# Patient Record
Sex: Male | Born: 1965 | Race: White | Hispanic: No | Marital: Single | Smoking: Never smoker
Health system: Southern US, Community
[De-identification: ages and names within clinical notes are randomized; demographics above are authoritative.]

## PROBLEM LIST (undated history)

## (undated) DIAGNOSIS — I10 Essential (primary) hypertension: Secondary | ICD-10-CM

## (undated) DIAGNOSIS — M8430XA Stress fracture, unspecified site, initial encounter for fracture: Secondary | ICD-10-CM

## (undated) DIAGNOSIS — M179 Osteoarthritis of knee, unspecified: Secondary | ICD-10-CM

## (undated) DIAGNOSIS — M4316 Spondylolisthesis, lumbar region: Secondary | ICD-10-CM

## (undated) DIAGNOSIS — M532X6 Spinal instabilities, lumbar region: Secondary | ICD-10-CM

## (undated) DIAGNOSIS — K469 Unspecified abdominal hernia without obstruction or gangrene: Secondary | ICD-10-CM

## (undated) DIAGNOSIS — E669 Obesity, unspecified: Secondary | ICD-10-CM

## (undated) DIAGNOSIS — K760 Fatty (change of) liver, not elsewhere classified: Secondary | ICD-10-CM

## (undated) DIAGNOSIS — G8929 Other chronic pain: Secondary | ICD-10-CM

## (undated) DIAGNOSIS — E559 Vitamin D deficiency, unspecified: Secondary | ICD-10-CM

## (undated) DIAGNOSIS — K219 Gastro-esophageal reflux disease without esophagitis: Secondary | ICD-10-CM

## (undated) DIAGNOSIS — I872 Venous insufficiency (chronic) (peripheral): Secondary | ICD-10-CM

## (undated) HISTORY — PX: FRACTURE SURGERY: SHX138

## (undated) HISTORY — PX: SEPTOPLASTY: SUR1290

## (undated) HISTORY — DX: Osteoarthritis of knee, unspecified: M17.9

## (undated) HISTORY — PX: REPLACEMENT TOTAL KNEE: SUR1224

## (undated) HISTORY — DX: Unspecified abdominal hernia without obstruction or gangrene: K46.9

## (undated) HISTORY — PX: HERNIA REPAIR: SHX51

## (undated) HISTORY — DX: Gastro-esophageal reflux disease without esophagitis: K21.9

## (undated) HISTORY — DX: Vitamin D deficiency, unspecified: E55.9

## (undated) HISTORY — DX: Venous insufficiency (chronic) (peripheral): I87.2

## (undated) HISTORY — PX: FOOT SURGERY: SHX648

## (undated) HISTORY — DX: Obesity, unspecified: E66.9

## (undated) HISTORY — DX: Stress fracture, unspecified site, initial encounter for fracture: M84.30XA

---

## 2004-04-14 ENCOUNTER — Ambulatory Visit: Payer: Self-pay | Admitting: Unknown Physician Specialty

## 2005-11-17 ENCOUNTER — Ambulatory Visit: Payer: Self-pay | Admitting: Ophthalmology

## 2008-03-02 ENCOUNTER — Ambulatory Visit: Payer: Self-pay | Admitting: Unknown Physician Specialty

## 2008-03-12 ENCOUNTER — Inpatient Hospital Stay: Payer: Self-pay | Admitting: Unknown Physician Specialty

## 2008-03-14 ENCOUNTER — Ambulatory Visit: Payer: Self-pay | Admitting: Cardiology

## 2009-05-08 HISTORY — PX: OTHER SURGICAL HISTORY: SHX169

## 2010-03-25 ENCOUNTER — Ambulatory Visit: Payer: Self-pay | Admitting: Unknown Physician Specialty

## 2010-04-05 ENCOUNTER — Inpatient Hospital Stay: Payer: Self-pay | Admitting: Unknown Physician Specialty

## 2010-12-20 ENCOUNTER — Ambulatory Visit: Payer: Self-pay

## 2010-12-23 ENCOUNTER — Ambulatory Visit: Payer: Self-pay | Admitting: Podiatry

## 2011-07-19 ENCOUNTER — Ambulatory Visit: Payer: Self-pay | Admitting: Internal Medicine

## 2013-06-02 ENCOUNTER — Ambulatory Visit: Payer: Self-pay | Admitting: Orthopedic Surgery

## 2014-02-06 ENCOUNTER — Ambulatory Visit: Payer: Self-pay | Admitting: Unknown Physician Specialty

## 2014-02-12 ENCOUNTER — Ambulatory Visit: Payer: Self-pay

## 2014-02-12 LAB — OCCULT BLOOD X 1 CARD TO LAB, STOOL: Occult Blood, Feces: POSITIVE

## 2014-06-18 ENCOUNTER — Ambulatory Visit: Payer: Self-pay

## 2016-02-07 ENCOUNTER — Other Ambulatory Visit: Payer: Self-pay | Admitting: Orthopedic Surgery

## 2016-02-07 DIAGNOSIS — M4316 Spondylolisthesis, lumbar region: Secondary | ICD-10-CM

## 2016-02-16 ENCOUNTER — Ambulatory Visit
Admission: RE | Admit: 2016-02-16 | Discharge: 2016-02-16 | Disposition: A | Discharge status: Home / Self Care | Payer: 59 | Source: Ambulatory Visit | Attending: Orthopedic Surgery | Admitting: Orthopedic Surgery

## 2016-02-16 DIAGNOSIS — M5126 Other intervertebral disc displacement, lumbar region: Secondary | ICD-10-CM | POA: Diagnosis not present

## 2016-02-16 DIAGNOSIS — M48061 Spinal stenosis, lumbar region without neurogenic claudication: Secondary | ICD-10-CM | POA: Diagnosis not present

## 2016-02-16 DIAGNOSIS — M4316 Spondylolisthesis, lumbar region: Secondary | ICD-10-CM | POA: Diagnosis present

## 2017-03-09 IMAGING — MR MR LUMBAR SPINE W/O CM
4 of 5 series · 24 of 48 positions shown · non-contrast
Comparison: None.

CLINICAL DATA: Lumbar spondylolisthesis. Back pain with left
buttock and leg pain

EXAM:
MRI LUMBAR SPINE WITHOUT CONTRAST
TECHNIQUE: Multiplanar, multisequence MR imaging of the lumbar spine was
performed. No intravenous contrast was administered.

[Series 2: T2 · sagittal · 4.0mm · 0.81mm/px · 6 of 15 slices shown (1 of 2)]
[im 1/15]
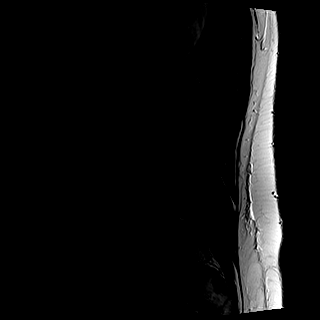
[im 3/15]
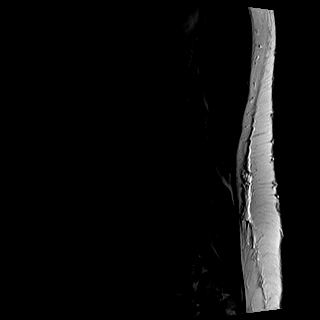
[im 6/15]
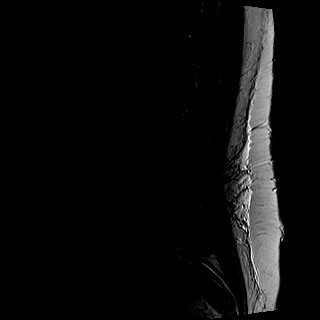
[im 9/15]
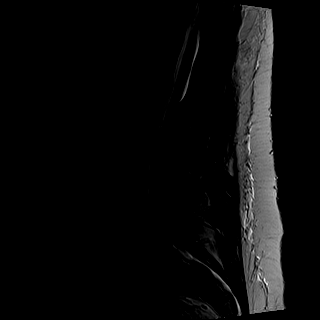
[im 12/15]
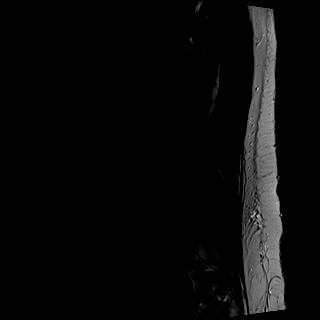
[im 15/15]
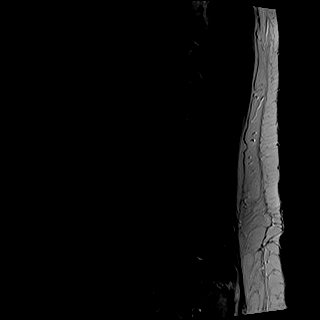

[Series 3: T1 · sagittal · 4.0mm · 0.41mm/px · 6 of 15 slices shown (1 of 2)]
[im 1/15]
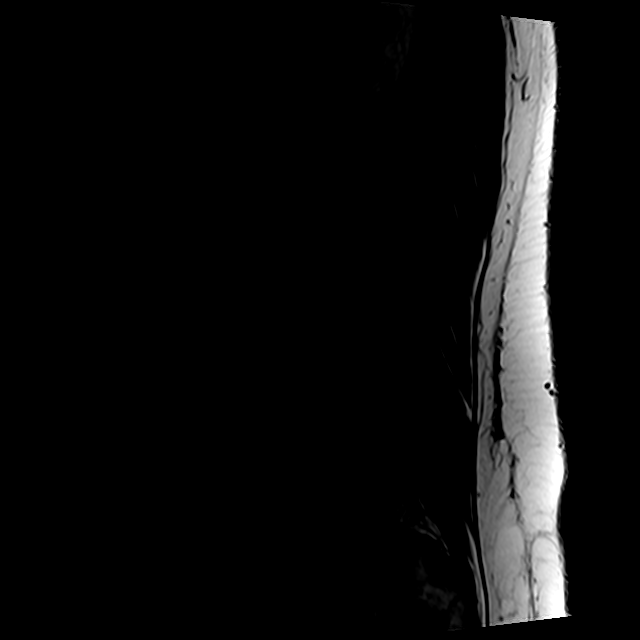
[im 3/15]
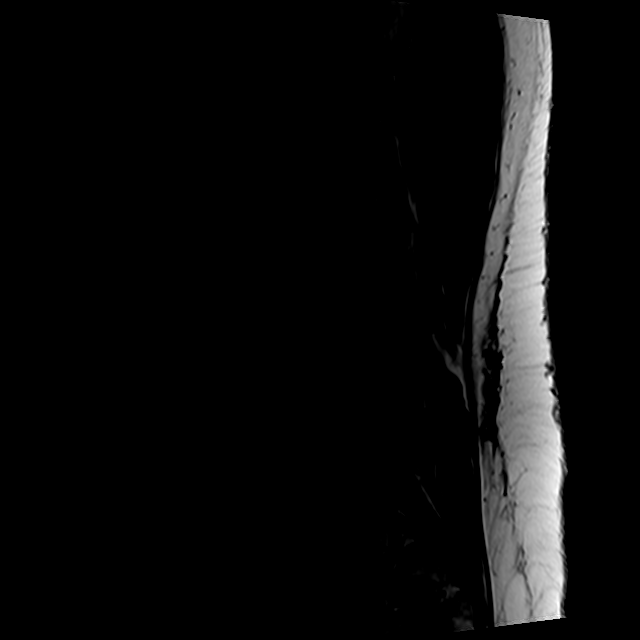
[im 6/15]
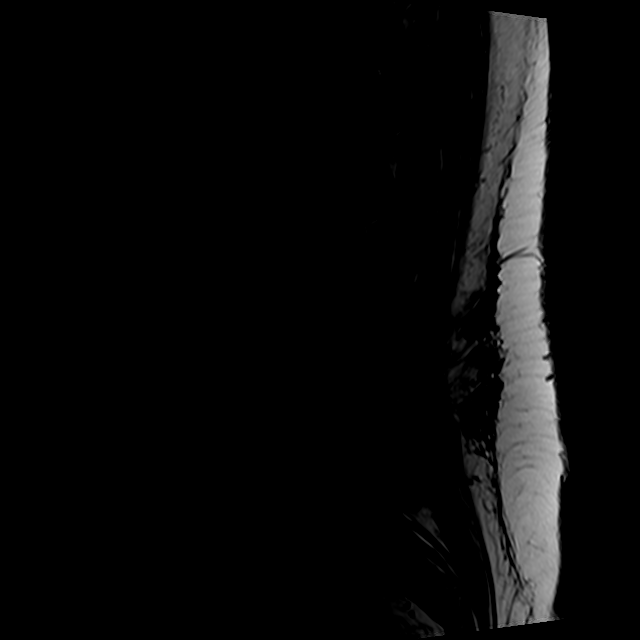
[im 9/15]
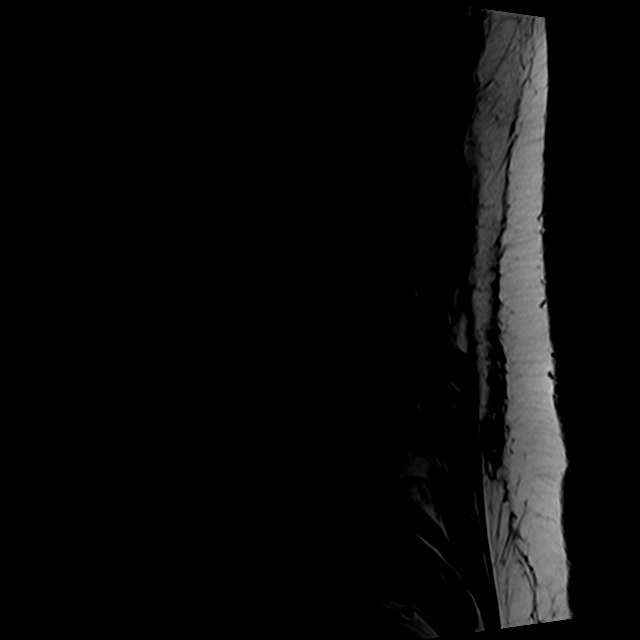
[im 12/15]
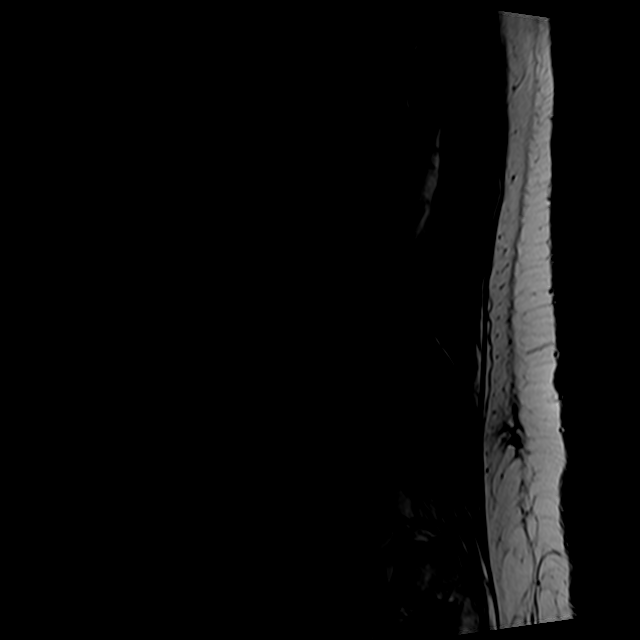
[im 15/15]
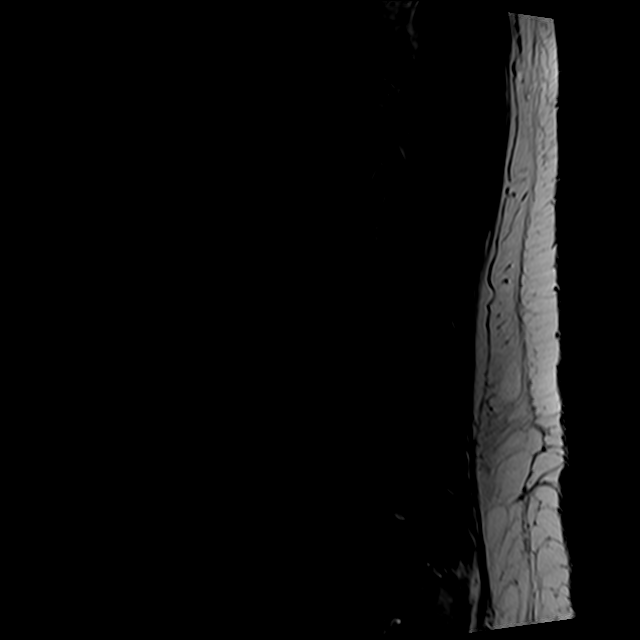

[Series 5: T2 · axial · 4.0mm · 0.78mm/px · z∈[-29,+188]mm · 9 of 37 slices shown (2 of 2)]
[im 1/37]
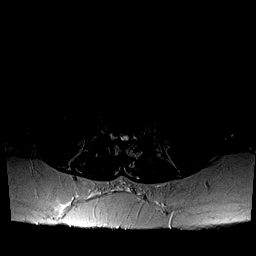
[im 6/37]
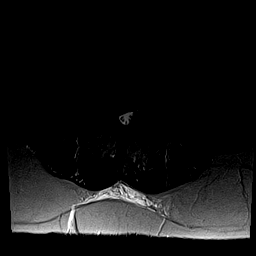
[im 11/37]
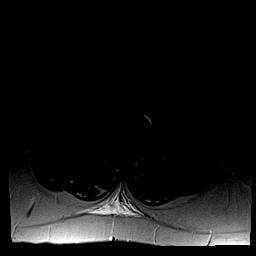
[im 16/37]
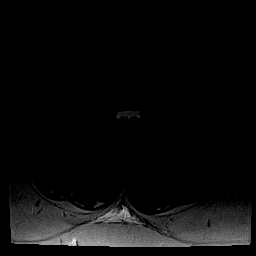
[im 19/37]
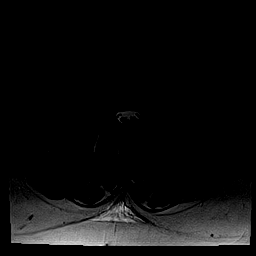
[im 21/37]
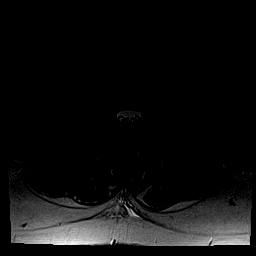
[im 26/37]
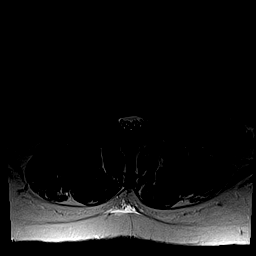
[im 31/37]
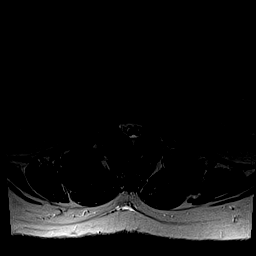
[im 37/37]
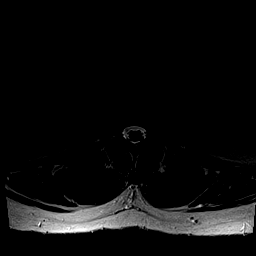

[Series 6: T1 · axial · 4.0mm · 0.31mm/px · z∈[-5,+159]mm · 3 of 37 slices shown (2 of 2)]
[im 6/37]
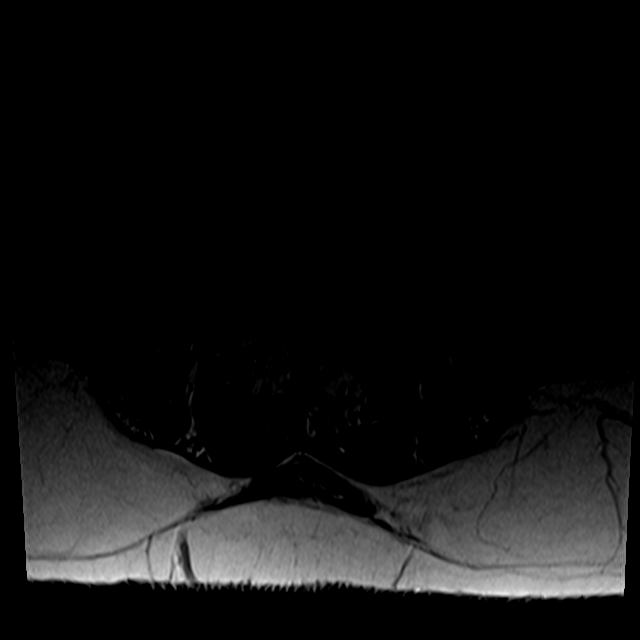
[im 19/37]
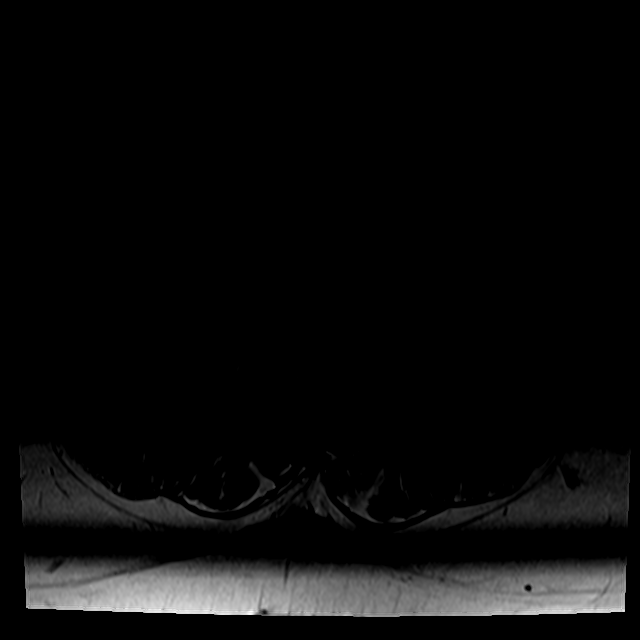
[im 31/37]
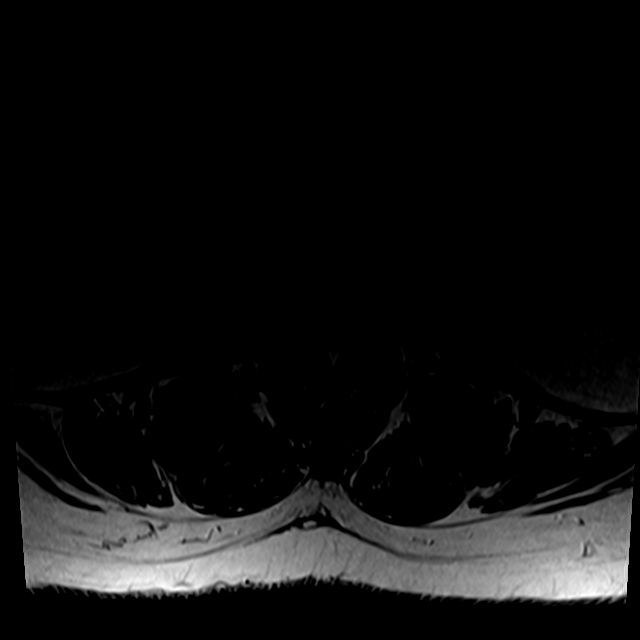

[24 of 48 positions shown; findings below may reference images not displayed]

FINDINGS: Segmentation:  Normal

Alignment: Mild retrolisthesis L1-2, L2-3, L3-4. Grade 1 anterior
listhesis L4-5.

Vertebrae:  Negative for fracture or mass.  Normal bone marrow.

Conus medullaris: Extends to the L2-3 level and appears normal.

Paraspinal and other soft tissues: Paraspinous muscles normal. No
retroperitoneal mass or adenopathy.

Disc levels:

L1-2:  Mild disc bulging without significant spinal stenosis.

L2-3: Mild disc bulging and mild facet degeneration. No significant
spinal or foraminal stenosis

L3-4: Mild disc and facet degeneration without significant stenosis.

L4-5: 3 mm anterior listhesis. Advanced facet degeneration. Severe
spinal stenosis. Mild foraminal stenosis bilaterally. Subarticular
stenosis bilaterally.

L5-S1: Small right paracentral disc protrusion with impingement of
the right S1 nerve root. Mild impingement of the left S1 nerve root
due to disc bulging and spurring. Mild foraminal narrowing
bilaterally. Bilateral facet hypertrophy.
IMPRESSION: Grade 1 anterior listhesis at L4-5 with severe spinal stenosis.
Subarticular stenosis bilaterally

Small right paracentral disc protrusion L5-S1 with impingement of
the right S1 nerve root.

## 2017-05-08 HISTORY — PX: COLONOSCOPY: SHX174

## 2017-09-11 ENCOUNTER — Other Ambulatory Visit: Payer: Self-pay | Admitting: Family Medicine

## 2017-09-11 DIAGNOSIS — R1013 Epigastric pain: Secondary | ICD-10-CM

## 2017-09-18 ENCOUNTER — Ambulatory Visit
Admission: RE | Admit: 2017-09-18 | Discharge: 2017-09-18 | Disposition: A | Discharge status: Home / Self Care | Payer: BLUE CROSS/BLUE SHIELD | Source: Ambulatory Visit | Attending: Family Medicine | Admitting: Family Medicine

## 2017-09-18 DIAGNOSIS — R1013 Epigastric pain: Secondary | ICD-10-CM | POA: Diagnosis not present

## 2019-01-27 ENCOUNTER — Ambulatory Visit: Payer: Self-pay

## 2019-01-27 DIAGNOSIS — Z23 Encounter for immunization: Secondary | ICD-10-CM

## 2019-01-27 LAB — POCT LIPID PANEL
HDL: 46
LDL: 48
Non-HDL: 70
TC/HDL: 2.5
TC: 116
TRG: 108

## 2019-01-27 LAB — GLUCOSE, POCT (MANUAL RESULT ENTRY): POC Glucose: 94 mg/dl (ref 70–99)

## 2019-01-27 NOTE — Progress Notes (Signed)
     Patient ID: Timothy Gilmore, male    DOB: [birth year], 53 y.o.   MRN: [id number]    Thank you!!  Apolonio Schneiders RN  [place] [facility] [place]: [phone]  Cell:  [phone] Website: [email or web address]

## 2019-01-28 ENCOUNTER — Other Ambulatory Visit: Payer: Self-pay

## 2019-12-17 ENCOUNTER — Ambulatory Visit (HOSPITAL_COMMUNITY)
Admission: EM | Admit: 2019-12-17 | Discharge: 2019-12-17 | Disposition: A | Discharge status: Home / Self Care | Payer: BC Managed Care – PPO

## 2019-12-17 ENCOUNTER — Other Ambulatory Visit: Payer: Self-pay

## 2019-12-17 ENCOUNTER — Ambulatory Visit
Admission: EM | Admit: 2019-12-17 | Discharge: 2019-12-17 | Disposition: A | Discharge status: Home / Self Care | Payer: BC Managed Care – PPO | Attending: Family Medicine | Admitting: Family Medicine

## 2019-12-17 ENCOUNTER — Encounter: Payer: Self-pay | Admitting: Emergency Medicine

## 2019-12-17 DIAGNOSIS — M71121 Other infective bursitis, right elbow: Secondary | ICD-10-CM | POA: Diagnosis not present

## 2019-12-17 DIAGNOSIS — M7021 Olecranon bursitis, right elbow: Secondary | ICD-10-CM

## 2019-12-17 HISTORY — DX: Essential (primary) hypertension: I10

## 2019-12-17 MED ORDER — MELOXICAM 15 MG PO TABS: 15.0000 mg | ORAL_TABLET | Freq: Every day | ORAL | 0 refills | Status: DC | PRN

## 2019-12-17 MED ORDER — SULFAMETHOXAZOLE-TRIMETHOPRIM 800-160 MG PO TABS: 1.0000 | ORAL_TABLET | Freq: Two times a day (BID) | ORAL | 0 refills | Status: AC

## 2019-12-17 NOTE — Discharge Instructions (Signed)
Rest, ice, elevation.  Medication as prescribed.  If you worsen (fever, worsening redness), please go to the ER.  Take care  Dr. Adriana Simas

## 2019-12-17 NOTE — ED Provider Notes (Signed)
[facility]    CSN: [id number] Arrival date & time: 12/17/19  0810  History   Chief Complaint Chief Complaint  Patient presents with  . Arm Swelling    right elbow    HPI   54 year old male presents with the above complaint.  Patient reports that he noticed a scab on his right elbow a few days ago.  He states that the elbow has been slightly painful.  This morning he awoke and it was red, warm, and swollen.  No fever.  No trauma, fall, injury.  He states that he takes Aleve every day for knee pain.  He states that this has not helped.  No current drainage.  No other reported symptoms.  No other complaints.  Past Medical History:  Diagnosis Date  . Hypertension    Home Medications    Prior to Admission medications   Medication Sig Start Date End Date Taking? Authorizing Provider  amLODipine (NORVASC) 10 MG tablet Take 10 mg by mouth daily. 10/05/19   [provider]  meloxicam (MOBIC) 15 MG tablet Take 1 tablet (15 mg total) by mouth daily as needed for pain. 12/17/19   Tommie Sams, DO  sulfamethoxazole-trimethoprim (BACTRIM DS) 800-160 MG tablet Take 1 tablet by mouth 2 (two) times daily for 7 days. 12/17/19 12/24/19  Tommie Sams, DO   Social History Social History   Tobacco Use  . Smoking status: Never Smoker  . Smokeless tobacco: Never Used  Vaping Use  . Vaping Use: Never used  Substance Use Topics  . Alcohol use: Yes  . Drug use: Never   Allergies   Penicillins  Review of Systems Review of Systems  Constitutional: Negative for fever.  Musculoskeletal:       Right elbow pain, redness.    Physical Exam Triage Vital Signs ED Triage Vitals  Enc Vitals Group     BP 12/17/19 0821 (!) 149/100     Pulse Rate 12/17/19 0821 (!) 120     Resp 12/17/19 0821 18     Temp 12/17/19 0821 98.2 F (36.8 C)     Temp src --      SpO2 12/17/19 0821 99 %     Weight 12/17/19 0819 245 lb (111.1 kg)     Height 12/17/19 0819 6\' 2"  (1.88 m)     Head  Circumference --      Peak Flow --      Pain Score 12/17/19 0819 0     Pain Loc --      Pain Edu? --      Excl. in GC? --    Updated Vital Signs BP (!) 149/100 (BP Location: Left Arm)   Pulse (!) 120   Temp 98.2 F (36.8 C)   Resp 18   Ht 6\' 2"  (1.88 m)   Wt 111.1 kg   SpO2 99%   BMI 31.46 kg/m   Visual Acuity Right Eye Distance:   Left Eye Distance:   Bilateral Distance:    Right Eye Near:   Left Eye Near:    Bilateral Near:     Physical Exam Vitals and nursing note reviewed.  Constitutional:      General: He is not in acute distress.    Appearance: Normal appearance. He is not ill-appearing.  HENT:     Head: Normocephalic and atraumatic.  Eyes:     General:        Right eye: No discharge.  Left eye: No discharge.     Conjunctiva/sclera: Conjunctivae normal.  Cardiovascular:     Rate and Rhythm: Regular rhythm. Tachycardia present.  Pulmonary:     Effort: Pulmonary effort is normal. No respiratory distress.  Musculoskeletal:     Comments: Right elbow -erythema, warmth and swelling noted over the right olecranon.  Redness appears to be streaking both proximally and distally.  Neurological:     Mental Status: He is alert.  Psychiatric:        Mood and Affect: Mood normal.        Behavior: Behavior normal.    UC Treatments / Results  Labs (all labs ordered are listed, but only abnormal results are displayed) Labs Reviewed - No data to display  EKG   Radiology No results found.  Procedures Procedures (including critical care time)  Medications Ordered in UC Medications - No data to display  Initial Impression / Assessment and Plan / UC Course  I have reviewed the triage vital signs and the nursing notes.  Pertinent labs & imaging results that were available during my care of the patient were reviewed by me and considered in my medical decision making (see chart for details).    54 year old male presents with olecranon bursitis with  concern for underlying infection.  Placing on Bactrim and meloxicam.  Final Clinical Impressions(s) / UC Diagnoses   Final diagnoses:  Olecranon bursitis, right elbow  Infection of olecranon bursa, right     Discharge Instructions     Rest, ice, elevation.  Medication as prescribed.  If you worsen (fever, worsening redness), please go to the ER.  Take care  Dr. Adriana Simas    ED Prescriptions    Medication Sig Dispense Auth. Provider   sulfamethoxazole-trimethoprim (BACTRIM DS) 800-160 MG tablet Take 1 tablet by mouth 2 (two) times daily for 7 days. 14 tablet Xee Hollman G, DO   meloxicam (MOBIC) 15 MG tablet Take 1 tablet (15 mg total) by mouth daily as needed for pain. 30 tablet Tommie Sams, DO     PDMP not reviewed this encounter.   Everlene Other [place], [place] 12/17/19 [phone]

## 2019-12-17 NOTE — ED Triage Notes (Signed)
Patient c/o right elbow swelling that started this morning. Denies injury. States he had a sore on his elbow a few days ago.

## 2022-02-06 ENCOUNTER — Other Ambulatory Visit: Payer: Self-pay | Admitting: Family Medicine

## 2022-02-06 DIAGNOSIS — K760 Fatty (change of) liver, not elsewhere classified: Secondary | ICD-10-CM

## 2022-05-12 ENCOUNTER — Ambulatory Visit: Payer: BC Managed Care – PPO

## 2022-07-07 ENCOUNTER — Ambulatory Visit
Admission: RE | Admit: 2022-07-07 | Discharge: 2022-07-07 | Disposition: A | Discharge status: Home / Self Care | Payer: BC Managed Care – PPO | Source: Ambulatory Visit | Attending: Family Medicine | Admitting: Family Medicine

## 2022-07-07 DIAGNOSIS — K760 Fatty (change of) liver, not elsewhere classified: Secondary | ICD-10-CM | POA: Diagnosis present

## 2022-07-12 ENCOUNTER — Other Ambulatory Visit: Payer: Self-pay | Admitting: Orthopedic Surgery

## 2022-07-12 DIAGNOSIS — M4807 Spinal stenosis, lumbosacral region: Secondary | ICD-10-CM

## 2022-07-19 ENCOUNTER — Other Ambulatory Visit: Payer: Self-pay

## 2022-07-19 ENCOUNTER — Ambulatory Visit
Admission: RE | Admit: 2022-07-19 | Discharge: 2022-07-19 | Disposition: A | Discharge status: Home / Self Care | Payer: Self-pay | Source: Ambulatory Visit | Attending: Orthopedic Surgery | Admitting: Orthopedic Surgery

## 2022-07-19 DIAGNOSIS — Z049 Encounter for examination and observation for unspecified reason: Secondary | ICD-10-CM

## 2022-07-25 NOTE — Progress Notes (Unsigned)
Referring Physician:  Sharyne Peach, MD [place],  [place] [address]  Primary Physician:  Sharyne Peach, MD  History of Present Illness: 07/27/2022 [name] has a history of GERD, obesity, venous stasis right LE, and fatty liver.   Last seen by Dr. Izora Ribas in 2017 for slip L4-L5 with severe central stenosis along with disc at L5-S1 on right side. He was scheduled for L4-S1 TLIF with PSF. This was not done.   He has intermittent (but more frequent) left sided LBP with left  posterior leg pain to knee and sometimes to ankle. Pain is worse when he is any position for over 45 minutes. Some relief with changing positions and grocery cart. Pain is worse as the day progresses. He has numbness in both feet. No weakness in legs.   Bowel/Bladder Dysfunction: none  Conservative measures:  Physical therapy:  seeing chiropractor once a month, exercise at home, no PT in last year.  Multimodal medical therapy including regular antiinflammatories: aleve  Injections: No epidural steroid injections  Past Surgery: No spinal surgery  Jefferson Lajuan Lines has no symptoms of cervical myelopathy.  The symptoms are causing a significant impact on the patient's life.   Review of Systems:  A 10 point review of systems is negative, except for the pertinent positives and negatives detailed in the HPI.  Past Medical History: Past Medical History:  Diagnosis Date   Hypertension     Past Surgical History: No past surgical history on file.  Allergies: Allergies as of 07/27/2022 - Review Complete 07/27/2022  Allergen Reaction Noted   Penicillins Rash 11/03/2011    Medications: Outpatient Encounter Medications as of 07/27/2022  Medication Sig   amLODipine (NORVASC) 10 MG tablet Take 10 mg by mouth daily.   ascorbic acid (VITAMIN C) 100 MG tablet Take by mouth.   cholecalciferol (VITAMIN D3) 10 MCG (400 UNIT) TABS tablet Take by mouth.   Loratadine 10 MG CAPS Take  by mouth.   metoprolol succinate (TOPROL-XL) 25 MG 24 hr tablet Take 25 mg by mouth daily.   naproxen (NAPROSYN) 500 MG tablet Take by mouth.   [DISCONTINUED] meloxicam (MOBIC) 15 MG tablet Take 1 tablet (15 mg total) by mouth daily as needed for pain.   No facility-administered encounter medications on file as of 07/27/2022.    Social History: Social History   Tobacco Use   Smoking status: Never   Smokeless tobacco: Never  Vaping Use   Vaping Use: Never used  Substance Use Topics   Alcohol use: Yes   Drug use: Never    Family Medical History: No family history on file.  Physical Examination: Vitals:   07/27/22 1344  BP: 124/80  Pulse: (!) 111  SpO2: 97%    General: Patient is well developed, well nourished, calm, collected, and in no apparent distress. Attention to examination is appropriate.  Respiratory: Patient is breathing without any difficulty.   NEUROLOGICAL:     Awake, alert, oriented to person, place, and time.  Speech is clear and fluent. Fund of knowledge is appropriate.   Cranial Nerves: Pupils equal round and reactive to light.  Facial tone is symmetric.    no posterior lumbar tenderness.   No abnormal lesions on exposed skin.   Strength: Side Biceps Triceps Deltoid Interossei Grip Wrist Ext. Wrist Flex.  R 5 5 5 5 5 5 5   L 5 5 5 5 5 5 5    Side Iliopsoas Quads Hamstring PF DF EHL  R 5 5 5 5 5 5   L 5 5 5 5 5 5    Reflexes are 2+ and symmetric at the biceps, triceps, brachioradialis, patella and achilles.   Hoffman's is absent.  Clonus is not present.   Bilateral upper and lower extremity sensation is intact to light touch.     Gait is normal.    Medical Decision Making  Imaging: Lumbar MRI dated 07/26/22:  FINDINGS: Segmentation: Conventional anatomy assumed, with the last open disc space designated L5-S1.Concordant with prior imaging.   Alignment: Degenerative grade 1 anterolisthesis at L4-5 measures 7 mm, similar to previous study.  There is a minimal anterolisthesis at L5-S1 as well.   Vertebrae: No worrisome osseous lesion, acute fracture or pars defect. 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 The visualized sacroiliac joints appear unremarkable.   Conus medullaris: Extends to the L2-3 level and appears normal.   Paraspinal and other soft tissues: No significant paraspinal findings.   Disc levels:   No significant disc space findings at T11-12 or T12-L1.   L1-2: Stable mild loss of disc height with disc bulging and endplate osteophytes. No significant spinal stenosis or nerve root encroachment.   L2-3: Mildly progressive loss of disc height with annular disc bulging, facet and ligamentous hypertrophy. No significant central spinal stenosis. Mild foraminal narrowing bilaterally without L2 nerve root encroachment.   L3-4: Mildly progressive loss of disc height with annular disc bulging, facet and ligamentous hypertrophy. No significant central spinal stenosis. Mild foraminal narrowing bilaterally without L3 nerve root encroachment.   L4-5: Severe multifactorial spinal stenosis again noted secondary to loss of disc height, disc bulging/uncovering and advanced bilateral facet hypertrophy. Moderate lateral recess and foraminal narrowing bilaterally appears mildly progressive.   L5-S1: Chronic loss of disc height with annular disc bulging and a chronic right paracentral disc protrusion. Progressive facet hypertrophy, worse on the right. Unchanged right lateral recess narrowing and probable chronic right S1 nerve root encroachment. Moderate foraminal narrowing bilaterally appears mildly progressive.   IMPRESSION: 1. Mildly progressive multilevel spondylosis compared with previous MRI from 2017. 2. At L4-5, there is severe multifactorial spinal stenosis with moderate lateral recess and foraminal narrowing bilaterally. 3. At L5-S1, there is chronic right S1 nerve root encroachment by  a chronic right paracentral disc protrusion. Moderate foraminal narrowing bilaterally appears mildly progressive. 4. No acute findings or significant changes at the other levels.     Electronically Signed   By: Richardean Sale M.D.   On: 07/26/2022 12:33  I have personally reviewed the images and agree with the above interpretation.   Lumbar xrays dated 07/06/22:  L4-L5 spondylolisthesis with probable pars defect L4, DDD L3-S1, facet hypertrophy L4-S1.   No formal xray report available for above lumbar xrays.   Assessment and Plan: Mr. Kunis is a pleasant 57 y.o. male has intermittent (but more frequent) left sided LBP with left  posterior leg pain to knee and sometimes to ankle. Pain is worse when he is any position for over 45 minutes. Some relief with changing positions and grocery cart.   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 He has severe spinal stenosis L4-L5 with right paracentral disc L5-S1 and chronic right S1 nerve encroachment.   He is ready to discuss surgery options with Dr. Izora Ribas.   Treatment options discussed with patient and following plan made:   - Order for physical therapy for lumbar spine sent to [facility] in [place]. - Will get lumbar flexion/extension xrays prior to  his follow up.  - Follow up with Dr. Izora Ribas in 6-8 weeks to discuss possible surgery options.   Of note, pulse was noted to be 111. He has discussed this with his PCP and is working on cutting down on caffeine. No symptoms of CP, SOB, dizziness, or heart racing. He will continue to follow with PCP.   I spent a total of 25 minutes in face-to-face and non-face-to-face activities related to this patient's care today including review of outside records, review of imaging, review of symptoms, physical exam, discussion of differential diagnosis, discussion of treatment options, and documentation.   Thank you for involving me in the care of this patient.   Geronimo Boot PA-C Dept. of  Neurosurgery

## 2022-07-26 ENCOUNTER — Ambulatory Visit
Admission: RE | Admit: 2022-07-26 | Discharge: 2022-07-26 | Disposition: A | Discharge status: Home / Self Care | Payer: BC Managed Care – PPO | Source: Ambulatory Visit | Attending: Orthopedic Surgery | Admitting: Orthopedic Surgery

## 2022-07-26 DIAGNOSIS — M4807 Spinal stenosis, lumbosacral region: Secondary | ICD-10-CM

## 2022-07-27 ENCOUNTER — Encounter: Payer: Self-pay | Admitting: Orthopedic Surgery

## 2022-07-27 ENCOUNTER — Ambulatory Visit: Payer: BC Managed Care – PPO | Admitting: Orthopedic Surgery

## 2022-07-27 VITALS — BP 124/80 | HR 111 | Ht 74.0 in | Wt 265.0 lb

## 2022-07-27 DIAGNOSIS — M4316 Spondylolisthesis, lumbar region: Secondary | ICD-10-CM | POA: Diagnosis not present

## 2022-07-27 DIAGNOSIS — M5416 Radiculopathy, lumbar region: Secondary | ICD-10-CM

## 2022-07-27 DIAGNOSIS — M48061 Spinal stenosis, lumbar region without neurogenic claudication: Secondary | ICD-10-CM | POA: Diagnosis not present

## 2022-07-27 DIAGNOSIS — M4726 Other spondylosis with radiculopathy, lumbar region: Secondary | ICD-10-CM

## 2022-07-27 DIAGNOSIS — M47816 Spondylosis without myelopathy or radiculopathy, lumbar region: Secondary | ICD-10-CM

## 2022-07-27 NOTE — Patient Instructions (Signed)
It was so nice to see you today. Thank you so much for coming in.    You have wear and tear in your back with spinal stenosis.   I ordered xrays of your lower back. You can get these at [facility] in [place]. You do not need any appointment.   I sent physical therapy orders to [facility] in [place]. You can call them at [phone] if you don't hear from them to schedule your visit.   Dr. Izora Ribas will see you back in 6-8 weeks. Please do not hesitate to call if you have any questions or concerns. You can also message me in [place].    Geronimo Boot PA-C [phone]

## 2022-09-07 ENCOUNTER — Ambulatory Visit
Admission: RE | Admit: 2022-09-07 | Discharge: 2022-09-07 | Disposition: A | Discharge status: Home / Self Care | Payer: BC Managed Care – PPO | Attending: Orthopedic Surgery | Admitting: Orthopedic Surgery

## 2022-09-07 ENCOUNTER — Ambulatory Visit
Admission: RE | Admit: 2022-09-07 | Discharge: 2022-09-07 | Disposition: A | Discharge status: Home / Self Care | Payer: BC Managed Care – PPO | Source: Ambulatory Visit | Attending: Orthopedic Surgery | Admitting: Orthopedic Surgery

## 2022-09-07 DIAGNOSIS — M48061 Spinal stenosis, lumbar region without neurogenic claudication: Secondary | ICD-10-CM | POA: Diagnosis present

## 2022-09-07 DIAGNOSIS — M4316 Spondylolisthesis, lumbar region: Secondary | ICD-10-CM | POA: Insufficient documentation

## 2022-09-07 DIAGNOSIS — M5416 Radiculopathy, lumbar region: Secondary | ICD-10-CM | POA: Diagnosis present

## 2022-09-07 DIAGNOSIS — M47816 Spondylosis without myelopathy or radiculopathy, lumbar region: Secondary | ICD-10-CM

## 2022-09-11 ENCOUNTER — Encounter: Payer: Self-pay | Admitting: Orthopedic Surgery

## 2022-09-11 NOTE — Progress Notes (Signed)
Lumbar xrays dated 09/07/22:  FINDINGS: Grade 1 anterolisthesis of L4 on L5 measures 10 mm in neutral position, with increase to 13 mm on flexion and decrease to 7 mm on extension. 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 Alignment is otherwise anatomic. Vertebral body heights are normal. Mild multilevel endplate degenerative changes with loss of disc space height. Facet hypertrophy in the lower lumbar spine.   IMPRESSION: 1. Grade 1 anterolisthesis of L4 on L5 with pathologic motion on flexion and extension. 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 2. Multilevel degenerative disc disease with facet hypertrophy in the lower lumbar spine.     Electronically Signed   By: Leanna Battles M.D.   On: 09/11/2022 16:40   I have personally reviewed the images and agree with the above interpretation.  He is doing PT now and it appears it is helping from his PT notes. He has f/u with Dr. Myer Haff on 5/16. Recommend this be pushed back until he is done with/discharged from PT.

## 2022-09-20 NOTE — Progress Notes (Signed)
Referring Physician:  Rayetta Humphrey, MD [address] [place],  [place] [address]  Primary Physician:  Rayetta Humphrey, MD  History of Present Illness: 09/21/2022 [name] is here today with a chief complaint of low back pain that radiates into the left buttock and posterior leg and stops at the knee, but will occasionally go into the ankle.  He has also had some pain into his right buttock and extending down the posterior aspect of his thigh.  He has had worsening pain in his back and legs for many years.  I saw him back in October 2017 with similar symptoms.  His symptoms have only worsened.  Standing and walking make his symptoms worse.  He sits or changes positions to help with the symptoms.  His symptoms are now causing significant impact on his daily life.  He has difficulty walking and going about his activities of daily living.    Bowel/Bladder Dysfunction: none  Conservative measures:  Physical therapy:  has participated in at [facility] from 08/24/22 to 09/18/22 and was discharged.  Multimodal medical therapy including regular antiinflammatories:  naproxen Injections:  has not received epidural steroid injections  Past Surgery: denies  Timothy Gilmore has no symptoms of cervical myelopathy.  The symptoms are causing a significant impact on the patient's life.   Progress Note from [place], [place] on 07/27/22:   History of Present Illness: 07/27/2022 [name] has a history of GERD, obesity, venous stasis right LE, and fatty liver.    Last seen by Dr. Myer Haff in 2017 for slip L4-L5 with severe central stenosis along with disc at L5-S1 on right side. He was scheduled for L4-S1 TLIF with PSF. This was not done.    He has intermittent (but more frequent) left sided LBP with left  posterior leg pain to knee and sometimes to ankle. Pain is worse when he is any position for over 45 minutes. Some relief with changing positions and grocery  cart. Pain is worse as the day progresses. He has numbness in both feet. No weakness in legs.   I have utilized the care everywhere function in epic to review the outside records available from external health systems.  Review of Systems:  A 10 point review of systems is negative, except for the pertinent positives and negatives detailed in the HPI.  Past Medical History: Past Medical History:  Diagnosis Date   Hypertension     Past Surgical History: No past surgical history on file.  Allergies: Allergies as of 09/21/2022 - Review Complete 09/21/2022  Allergen Reaction Noted   Penicillins Rash 11/03/2011    Medications:  Current Outpatient Medications:    amLODipine (NORVASC) 10 MG tablet, Take 10 mg by mouth daily., Disp: , Rfl:    ascorbic acid (VITAMIN C) 100 MG tablet, Take by mouth., Disp: , Rfl:    cholecalciferol (VITAMIN D3) 10 MCG (400 UNIT) TABS tablet, Take by mouth., Disp: , Rfl:    Loratadine 10 MG CAPS, Take by mouth., Disp: , Rfl:    metoprolol succinate (TOPROL-XL) 25 MG 24 hr tablet, Take 25 mg by mouth daily., Disp: , Rfl:    naproxen (NAPROSYN) 500 MG tablet, Take by mouth., Disp: , Rfl:   Social History: Social History   Tobacco Use   Smoking status: Never   Smokeless tobacco: Never  Vaping Use   Vaping Use: Never used  Substance Use Topics   Alcohol use: Yes   Drug use:  Never    Family Medical History: No family history on file.  Physical Examination: Vitals:   09/21/22 1356  BP: (!) 140/90    General: Patient is in no apparent distress. Attention to examination is appropriate.  Neck:   Supple.  Full range of motion.  Respiratory: Patient is breathing without any difficulty.   NEUROLOGICAL:     Awake, alert, oriented to person, place, and time.  Speech is clear and fluent.   Cranial Nerves: Pupils equal round and reactive to light.  Facial tone is symmetric.  Facial sensation is symmetric. Shoulder shrug is symmetric. Tongue  protrusion is midline.  There is no pronator drift.  Strength: Side Biceps Triceps Deltoid Interossei Grip Wrist Ext. Wrist Flex.  R 5 5 5 5 5 5 5   L 5 5 5 5 5 5 5    Side Iliopsoas Quads Hamstring PF DF EHL  R 5 5 5 5 5 5   L 5 5 5 5 5 5    Reflexes are 1+ and symmetric at the biceps, triceps, brachioradialis, patella and achilles.   Hoffman's is absent.   Bilateral upper and lower extremity sensation is intact to light touch.    No evidence of dysmetria noted.  Gait is antalgic.  Straight leg raise is positive on the right at 45 degrees.     Medical Decision Making  Imaging: MRI L spine 07/26/2022 IMPRESSION: 1. Mildly progressive multilevel spondylosis compared with previous MRI from 2017. 2. At L4-5, there is severe multifactorial spinal stenosis with moderate lateral recess and foraminal narrowing bilaterally. 3. At L5-S1, there is chronic right S1 nerve root encroachment by a chronic right paracentral disc protrusion. Moderate foraminal narrowing bilaterally appears mildly progressive. 4. No acute findings or significant changes at the other levels.     Electronically Signed   By: [place] Bullocks M.D.   On: 07/26/2022 12:33  I have personally reviewed the images and agree with the above interpretation.  Assessment and Plan: Timothy Gilmore is a pleasant 57 y.o. male with spondylolisthesis at L4-5 with lumbar spinal instability as demonstrated on flexion-extension radiographs with a 6 mm change in L4 and L5 position.  He has back pain with radicular symptoms including sciatica.  He has symptoms concerning for right-sided S1 radiculopathy as well.  He has tried and failed conservative management including physical therapy and medications.  At this point, I recommend surgical intervention.  Given the instability at L4-5, I recommended that he undergo surgical fixation.  I have recommended L4-5 lateral lumbar interbody fusion to address his disc height loss and anterolisthesis  with percutaneous fixation posteriorly.  Will also perform right-sided L5-S1 microdiscectomy to address his S1 nerve root compression.  I discussed the planned procedure at length with the patient, including the risks, benefits, alternatives, and indications. The risks discussed include but are not limited to bleeding, infection, need for reoperation, spinal fluid leak, stroke, vision loss, anesthetic complication, coma, paralysis, and even death. I also described the possibility of psoas weakness and paresthesias. I described in detail that improvement was not guaranteed.  The patient expressed understanding of these risks, and asked that we proceed with surgery. I described the surgery in layman's terms, and gave ample opportunity for questions, which were answered to the best of my ability.   I spent a total of 30 minutes in this patient's care today. This time was spent reviewing pertinent records including imaging studies, obtaining and confirming history, performing a directed evaluation, formulating and discussing my recommendations, and  documenting the visit within the medical record.    Thank you for involving me in the care of this patient.      Nikhita Mentzel K. Myer Haff MD, [facility]surgery

## 2022-09-21 ENCOUNTER — Ambulatory Visit: Payer: BC Managed Care – PPO | Admitting: Neurosurgery

## 2022-09-21 ENCOUNTER — Encounter: Payer: Self-pay | Admitting: Neurosurgery

## 2022-09-21 VITALS — BP 140/90 | Ht 73.5 in | Wt 265.8 lb

## 2022-09-21 DIAGNOSIS — M4316 Spondylolisthesis, lumbar region: Secondary | ICD-10-CM

## 2022-09-21 DIAGNOSIS — M5441 Lumbago with sciatica, right side: Secondary | ICD-10-CM

## 2022-09-21 DIAGNOSIS — M532X6 Spinal instabilities, lumbar region: Secondary | ICD-10-CM | POA: Diagnosis not present

## 2022-09-21 DIAGNOSIS — M5442 Lumbago with sciatica, left side: Secondary | ICD-10-CM

## 2022-09-21 DIAGNOSIS — G8929 Other chronic pain: Secondary | ICD-10-CM

## 2022-09-21 NOTE — Patient Instructions (Signed)
Please see below for information in regards to your upcoming surgery:  [email or web address]    Planned surgery: L4-5 lateral lumbar interbody fusion (XLIF) and posterior spinal fusion, Right L5-S1 microdiscectomy   Surgery date: 11/20/22 - you will find out your arrival time the business day before your surgery.   Pre-op appointment at [facility] Pre-admit Testing: we will call you with a date/time for this. Pre-admit testing is located on the first floor of the [facility] building, [address] [facility] [address], [address]. Please bring all prescriptions in the original prescription bottles to your appointment, even if you have reviewed medications by phone with a pharmacy representative. Pre-op labs may be done at your pre-op appointment. You are not required to fast for these labs. Should you need to change your pre-op appointment, please call Pre-admit testing at [phone].    Surgical clearance: we will send a clearance form to Dr Greggory Stallion   NSAIDS (Non-steroidal anti-inflammatory drugs): because you are having a fusion, no NSAIDS (such as ibuprofen, aleve, naproxen, meloxicam, diclofenac) for 3 months after surgery. Celebrex is an exception. Tylenol is ok because it is not an NSAID.   Because you are having a fusion: for appointments after your 2 week follow-up: please arrive at the [facility] outpatient imaging center ([address], [address], [place]) or [facility] one hour prior to your appointment for x-rays. This applies to every appointment after your 2 week follow-up. Failure to do so may result in your appointment being rescheduled.   If you have FMLA/disability paperwork, please drop it off or fax it to [phone], attention Patty.   We can be reached by phone or mychart 8am-4pm, Monday-Friday. If you have any questions/concerns before or after  surgery, you can reach [place] at [phone], or you can send a mychart message. Please note mychart messages are not monitored during evenings, weekends, and holidays.   Appointments/FMLA & disability paperwork: Patty & Cristin  Nurse: Royston Cowper  Medical assistants: Laurann Montana Physician Assistant's: Manning Charity & Drake Leach Surgeon: Venetia Night, MD

## 2022-10-13 ENCOUNTER — Other Ambulatory Visit: Payer: Self-pay

## 2022-10-13 DIAGNOSIS — Z01818 Encounter for other preprocedural examination: Secondary | ICD-10-CM

## 2022-11-07 ENCOUNTER — Encounter
Admission: RE | Admit: 2022-11-07 | Discharge: 2022-11-07 | Disposition: A | Discharge status: Home / Self Care | Payer: BC Managed Care – PPO | Source: Ambulatory Visit | Attending: Neurosurgery | Admitting: Neurosurgery

## 2022-11-07 ENCOUNTER — Other Ambulatory Visit: Payer: Self-pay

## 2022-11-07 VITALS — BP 143/106 | HR 113 | Temp 98.6°F | Resp 20 | Ht 72.0 in | Wt 263.2 lb

## 2022-11-07 DIAGNOSIS — M4316 Spondylolisthesis, lumbar region: Secondary | ICD-10-CM | POA: Diagnosis not present

## 2022-11-07 DIAGNOSIS — Z01818 Encounter for other preprocedural examination: Secondary | ICD-10-CM | POA: Insufficient documentation

## 2022-11-07 DIAGNOSIS — Z01812 Encounter for preprocedural laboratory examination: Secondary | ICD-10-CM

## 2022-11-07 HISTORY — DX: Spinal instabilities, lumbar region: M53.2X6

## 2022-11-07 HISTORY — DX: Fatty (change of) liver, not elsewhere classified: K76.0

## 2022-11-07 HISTORY — DX: Other chronic pain: G89.29

## 2022-11-07 HISTORY — DX: Spondylolisthesis, lumbar region: M43.16

## 2022-11-07 LAB — BASIC METABOLIC PANEL
Anion gap: 8 (ref 5–15)
BUN: 14 mg/dL (ref 6–20)
CO2: 25 mmol/L (ref 22–32)
Calcium: 9.4 mg/dL (ref 8.9–10.3)
Chloride: 106 mmol/L (ref 98–111)
Creatinine, Ser: 0.75 mg/dL (ref 0.61–1.24)
GFR, Estimated: >60 mL/min (ref >60)
Glucose, Bld: 116 mg/dL — ABNORMAL HIGH (ref 70–99)
Potassium: 3.6 mmol/L (ref 3.5–5.1)
Sodium: 139 mmol/L (ref 135–145)

## 2022-11-07 LAB — SURGICAL PCR SCREEN
MRSA, PCR: NEGATIVE
Staphylococcus aureus: POSITIVE — AB

## 2022-11-07 LAB — CBC
HCT: 48.6 % (ref 39.0–52.0)
Hemoglobin: 16.6 g/dL (ref 13.0–17.0)
MCH: 30.7 pg (ref 26.0–34.0)
MCHC: 34.2 g/dL (ref 30.0–36.0)
MCV: 90 fL (ref 80.0–100.0)
Platelets: 291 10*3/uL (ref 150–400)
RBC: 5.4 MIL/uL (ref 4.22–5.81)
RDW: 12.1 % (ref 11.5–15.5)
WBC: 9.7 10*3/uL (ref 4.0–10.5)
nRBC: 0 % (ref 0.0–0.2)

## 2022-11-07 LAB — URINALYSIS, COMPLETE (UACMP) WITH MICROSCOPIC
Bilirubin Urine: NEGATIVE
Glucose, UA: NEGATIVE mg/dL
Hgb urine dipstick: NEGATIVE
Ketones, ur: 5 mg/dL — AB
Leukocytes,Ua: NEGATIVE
Nitrite: NEGATIVE
Protein, ur: 30 mg/dL — AB
Specific Gravity, Urine: 1.024 (ref 1.005–1.030)
pH: 5 (ref 5.0–8.0)

## 2022-11-07 LAB — TYPE AND SCREEN
ABO/RH(D): O POS
Antibody Screen: NEGATIVE

## 2022-11-07 NOTE — Patient Instructions (Addendum)
Your procedure is scheduled on: 11/20/2022  Report to the Registration Desk on the 1st floor of the [facility]. To find out your arrival time, please call [phone] between 1PM - 3PM on: 11/19/2022  If your arrival time is 6:00 am, do not arrive before that time as the [facility] entrance doors do not open until 6:00 am.  REMEMBER: Instructions that are not followed completely may result in serious medical risk, up to and including death; or upon the discretion of your surgeon and anesthesiologist your surgery may need to be rescheduled.  Do not eat food after midnight the night before surgery.  No gum chewing or hard candies.  You may however, drink CLEAR liquids up to 2 hours before you are scheduled to arrive for your surgery. Do not drink anything within 2 hours of your scheduled arrival time.  Clear liquids include: - water  - apple juice without pulp - gatorade (not RED colors) - black coffee or tea (Do NOT add milk or creamers to the coffee or tea) Do NOT drink anything that is not on this list.    One week prior to surgery: Stop ANY OVER THE COUNTER supplements until after surgery like vit C and Vit D3  You may however, continue to take Tylenol if needed for pain up until the day of surgery.  Continue taking all prescribed medications as prescribed.  TAKE ONLY THESE MEDICATIONS THE MORNING OF SURGERY WITH A SIP OF WATER:  amLODipine (NORVASC) 2. metoprolol succinate (TOPROL    No Alcohol for 24 hours before or after surgery.  No Smoking including e-cigarettes for 24 hours before surgery.  No chewable tobacco products for at least 6 hours before surgery.  No nicotine patches on the day of surgery.  Do not use any "recreational" drugs for at least a week (preferably 2 weeks) before your surgery.  Please be advised that the combination of cocaine and anesthesia may have negative outcomes, up to and including death. If you test positive for cocaine, your surgery  will be cancelled.  On the morning of surgery brush your teeth with toothpaste and water, you may rinse your mouth with mouthwash if you wish. Do not swallow any toothpaste or mouthwash.  Use CHG Soap as directed on instruction sheet.  Do not wear jewelry, make-up, hairpins, clips or nail polish.  Do not wear lotions, powders, or perfumes.   Do not shave body hair from the neck down 48 hours before surgery.  Contact lenses, hearing aids and dentures may not be worn into surgery.  Do not bring valuables to the hospital. [facility] is not responsible for any missing/lost belongings or valuables.   Notify your doctor if there is any change in your medical condition (cold, fever, infection).  Wear comfortable clothing (specific to your surgery type) to the hospital.  After surgery, you can help prevent lung complications by doing breathing exercises.  Take deep breaths and cough every 1-2 hours. Your doctor may order a device called an Incentive Spirometer to help you take deep breaths. If you are being admitted to the hospital overnight, leave your suitcase in the car. After surgery it may be brought to your room.  In case of increased patient census, it may be necessary for you, the patient, to continue your postoperative care in the Same Day Surgery department.  If you are being discharged the day of surgery, you will not be allowed to drive home. You will need a responsible individual to  drive you home and stay with you for 24 hours after surgery.   Please call the Pre-admissions Testing Dept. at [phone] if you have any questions about these instructions.  Surgery Visitation Policy:  Patients having surgery or a procedure may have two visitors.  Children under the age of 66 must have an adult with them who is not the patient.  Inpatient Visitation:    Visiting hours are 7 a.m. to 8 p.m. Up to four visitors are allowed at one time in a patient room. The visitors may  rotate out with other people during the day.  One visitor age 50 or older may stay with the patient overnight and must be in the room by 8 p.m.      Pre-operative 5 CHG Bath Instructions   You can play a key role in reducing the risk of infection after surgery. Your skin needs to be as free of germs as possible. You can reduce the number of germs on your skin by washing with CHG (chlorhexidine gluconate) soap before surgery. CHG is an antiseptic soap that kills germs and continues to kill germs even after washing.   DO NOT use if you have an allergy to chlorhexidine/CHG or antibacterial soaps. If your skin becomes reddened or irritated, stop using the CHG and notify one of our RNs at [phone].   Please shower with the CHG soap starting 4 days before surgery using the following schedule:     Please keep in mind the following:  DO NOT shave, including legs and underarms, starting the day of your first shower.   You may shave your face at any point before/day of surgery.  Place clean sheets on your bed the day you start using CHG soap. Use a clean washcloth (not used since being washed) for each shower. DO NOT sleep with pets once you start using the CHG.   CHG Shower Instructions:  If you choose to wash your hair and private area, wash first with your normal shampoo/soap.  After you use shampoo/soap, rinse your hair and body thoroughly to remove shampoo/soap residue.  Turn the water OFF and apply about 3 tablespoons (45 ml) of CHG soap to a CLEAN washcloth.  Apply CHG soap ONLY FROM YOUR NECK DOWN TO YOUR TOES (washing for 3-5 minutes)  DO NOT use CHG soap on face, private areas, open wounds, or sores.  Pay special attention to the area where your surgery is being performed.  If you are having back surgery, having someone wash your back for you may be helpful. Wait 2 minutes after CHG soap is applied, then you may rinse off the CHG soap.  Pat dry with a clean towel  Put on clean  clothes/pajamas   If you choose to wear lotion, please use ONLY the CHG-compatible lotions on the back of this paper.     Additional instructions for the day of surgery: DO NOT APPLY any lotions, deodorants, cologne, or perfumes.   Put on clean/comfortable clothes.  Brush your teeth.  Ask your nurse before applying any prescription medications to the skin.      CHG Compatible Lotions   Aveeno Moisturizing lotion  Cetaphil Moisturizing Cream  Cetaphil Moisturizing Lotion  Clairol Herbal Essence Moisturizing Lotion, Dry Skin  Clairol Herbal Essence Moisturizing Lotion, Extra Dry Skin  Clairol Herbal Essence Moisturizing Lotion, Normal Skin  Curel Age Defying Therapeutic Moisturizing Lotion with Alpha Hydroxy  Curel Extreme Care Body Lotion  Curel Soothing Hands Moisturizing Hand Lotion  Curel Therapeutic Moisturizing Cream, Fragrance-Free  Curel Therapeutic Moisturizing Lotion, Fragrance-Free  Curel Therapeutic Moisturizing Lotion, Original Formula  Eucerin Daily Replenishing Lotion  Eucerin Dry Skin Therapy Plus Alpha Hydroxy Crme  Eucerin Dry Skin Therapy Plus Alpha Hydroxy Lotion  Eucerin Original Crme  Eucerin Original Lotion  Eucerin Plus Crme Eucerin Plus Lotion  Eucerin TriLipid Replenishing Lotion  Keri Anti-Bacterial Hand Lotion  Keri Deep Conditioning Original Lotion Dry Skin Formula Softly Scented  Keri Deep Conditioning Original Lotion, Fragrance Free Sensitive Skin Formula  Keri Lotion Fast Absorbing Fragrance Free Sensitive Skin Formula  Keri Lotion Fast Absorbing Softly Scented Dry Skin Formula  Keri Original Lotion  Keri Skin Renewal Lotion Keri Silky Smooth Lotion  Keri Silky Smooth Sensitive Skin Lotion  Nivea Body Creamy Conditioning Oil  Nivea Body Extra Enriched Teacher, adult education Moisturizing Lotion Nivea Crme  Nivea Skin Firming Lotion  NutraDerm 30 Skin Lotion  NutraDerm Skin Lotion  NutraDerm Therapeutic  Skin Cream  NutraDerm Therapeutic Skin Lotion  ProShield Protective Hand Cream  Provon moisturizing lotion

## 2022-11-20 ENCOUNTER — Other Ambulatory Visit: Payer: Self-pay

## 2022-11-20 ENCOUNTER — Ambulatory Visit: Payer: BC Managed Care – PPO | Admitting: Certified Registered"

## 2022-11-20 ENCOUNTER — Encounter: Payer: Self-pay | Admitting: Neurosurgery

## 2022-11-20 ENCOUNTER — Encounter
Admission: RE | Disposition: A | Discharge status: Home / Self Care | Payer: Self-pay | Source: Home / Self Care | Attending: Neurosurgery

## 2022-11-20 ENCOUNTER — Ambulatory Visit: Payer: BC Managed Care – PPO | Admitting: Urgent Care

## 2022-11-20 ENCOUNTER — Ambulatory Visit: Payer: BC Managed Care – PPO

## 2022-11-20 ENCOUNTER — Observation Stay
Admission: RE | Admit: 2022-11-20 | Discharge: 2022-11-21 | Disposition: A | Discharge status: Home / Self Care | Payer: BC Managed Care – PPO | Attending: Neurosurgery | Admitting: Neurosurgery

## 2022-11-20 DIAGNOSIS — G8929 Other chronic pain: Secondary | ICD-10-CM

## 2022-11-20 DIAGNOSIS — Z79899 Other long term (current) drug therapy: Secondary | ICD-10-CM | POA: Insufficient documentation

## 2022-11-20 DIAGNOSIS — Z981 Arthrodesis status: Principal | ICD-10-CM

## 2022-11-20 DIAGNOSIS — M532X6 Spinal instabilities, lumbar region: Secondary | ICD-10-CM | POA: Diagnosis not present

## 2022-11-20 DIAGNOSIS — M4316 Spondylolisthesis, lumbar region: Secondary | ICD-10-CM | POA: Diagnosis present

## 2022-11-20 DIAGNOSIS — M5442 Lumbago with sciatica, left side: Secondary | ICD-10-CM | POA: Diagnosis not present

## 2022-11-20 DIAGNOSIS — Z96651 Presence of right artificial knee joint: Secondary | ICD-10-CM | POA: Diagnosis not present

## 2022-11-20 DIAGNOSIS — I10 Essential (primary) hypertension: Secondary | ICD-10-CM | POA: Diagnosis not present

## 2022-11-20 DIAGNOSIS — M5416 Radiculopathy, lumbar region: Secondary | ICD-10-CM | POA: Diagnosis not present

## 2022-11-20 DIAGNOSIS — M5441 Lumbago with sciatica, right side: Secondary | ICD-10-CM

## 2022-11-20 DIAGNOSIS — Z01818 Encounter for other preprocedural examination: Secondary | ICD-10-CM

## 2022-11-20 HISTORY — PX: APPLICATION OF INTRAOPERATIVE CT SCAN: SHX6668

## 2022-11-20 HISTORY — PX: ANTERIOR LATERAL LUMBAR FUSION WITH PERCUTANEOUS SCREW 1 LEVEL: SHX5553

## 2022-11-20 HISTORY — PX: LUMBAR LAMINECTOMY/DECOMPRESSION MICRODISCECTOMY: SHX5026

## 2022-11-20 LAB — ABO/RH: ABO/RH(D): O POS

## 2022-11-20 SURGERY — ANTERIOR LATERAL LUMBAR FUSION WITH PERCUTANEOUS SCREW 1 LEVEL
Anesthesia: General | Site: Spine Lumbar | Laterality: Right

## 2022-11-20 MED ORDER — BISACODYL 10 MG RE SUPP: 10.0000 mg | Freq: Every day | RECTAL | Status: DC | PRN

## 2022-11-20 MED ORDER — ORAL CARE MOUTH RINSE: 15.0000 mL | Freq: Once | OROMUCOSAL | Status: AC

## 2022-11-20 MED ORDER — BUPIVACAINE LIPOSOME 1.3 % IJ SUSP
INTRAMUSCULAR | Status: AC
  Filled 2022-11-20: qty 20

## 2022-11-20 MED ORDER — ACETAMINOPHEN 10 MG/ML IV SOLN
INTRAVENOUS | Status: DC | PRN
  Administered 2022-11-20: 1000 mg via INTRAVENOUS

## 2022-11-20 MED ORDER — SODIUM CHLORIDE 0.9 % IV SOLN: INTRAVENOUS | Status: DC

## 2022-11-20 MED ORDER — OXYCODONE HCL 5 MG/5ML PO SOLN: 5.0000 mg | Freq: Once | ORAL | Status: AC | PRN

## 2022-11-20 MED ORDER — 0.9 % SODIUM CHLORIDE (POUR BTL) OPTIME
TOPICAL | Status: DC | PRN
  Administered 2022-11-20: 1000 mL

## 2022-11-20 MED ORDER — ONDANSETRON HCL 4 MG/2ML IJ SOLN: 4.0000 mg | Freq: Four times a day (QID) | INTRAMUSCULAR | Status: DC | PRN

## 2022-11-20 MED ORDER — GLYCOPYRROLATE 0.2 MG/ML IJ SOLN
INTRAMUSCULAR | Status: DC | PRN
  Administered 2022-11-20: .2 mg via INTRAVENOUS

## 2022-11-20 MED ORDER — SODIUM CHLORIDE 0.9% FLUSH: 3.0000 mL | INTRAVENOUS | Status: DC | PRN

## 2022-11-20 MED ORDER — BUPIVACAINE HCL (PF) 0.5 % IJ SOLN
INTRAMUSCULAR | Status: AC
  Filled 2022-11-20: qty 30

## 2022-11-20 MED ORDER — CEFAZOLIN IN SODIUM CHLORIDE 3-0.9 GM/100ML-% IV SOLN
3.0000 g | Freq: Once | INTRAVENOUS | Status: AC
  Administered 2022-11-20: 3 g via INTRAVENOUS
  Filled 2022-11-20: qty 100

## 2022-11-20 MED ORDER — MIDAZOLAM HCL 2 MG/2ML IJ SOLN
INTRAMUSCULAR | Status: AC
  Filled 2022-11-20: qty 2

## 2022-11-20 MED ORDER — BUPIVACAINE-EPINEPHRINE (PF) 0.5% -1:200000 IJ SOLN
INTRAMUSCULAR | Status: DC | PRN
  Administered 2022-11-20: 4 mL via PERINEURAL
  Administered 2022-11-20: 16 mL via PERINEURAL

## 2022-11-20 MED ORDER — CHLORHEXIDINE GLUCONATE 0.12 % MT SOLN
15.0000 mL | Freq: Once | OROMUCOSAL | Status: AC
  Administered 2022-11-20: 15 mL via OROMUCOSAL

## 2022-11-20 MED ORDER — HYDROMORPHONE HCL 1 MG/ML IJ SOLN
INTRAMUSCULAR | Status: AC
  Filled 2022-11-20: qty 1

## 2022-11-20 MED ORDER — REMIFENTANIL HCL 1 MG IV SOLR
INTRAVENOUS | Status: AC
  Filled 2022-11-20: qty 1000

## 2022-11-20 MED ORDER — EPHEDRINE SULFATE (PRESSORS) 50 MG/ML IJ SOLN
INTRAMUSCULAR | Status: DC | PRN
  Administered 2022-11-20 (×2): 5 mg via INTRAVENOUS

## 2022-11-20 MED ORDER — METHOCARBAMOL 1000 MG/10ML IJ SOLN: 500.0000 mg | Freq: Four times a day (QID) | INTRAVENOUS | Status: DC | PRN

## 2022-11-20 MED ORDER — LACTATED RINGERS IV SOLN: INTRAVENOUS | Status: DC

## 2022-11-20 MED ORDER — METOPROLOL SUCCINATE ER 25 MG PO TB24
25.0000 mg | ORAL_TABLET | Freq: Every day | ORAL | Status: DC
  Administered 2022-11-21: 25 mg via ORAL
  Filled 2022-11-20: qty 1

## 2022-11-20 MED ORDER — FENTANYL CITRATE (PF) 100 MCG/2ML IJ SOLN
INTRAMUSCULAR | Status: DC | PRN
  Administered 2022-11-20: 100 ug via INTRAVENOUS

## 2022-11-20 MED ORDER — AMLODIPINE BESYLATE 10 MG PO TABS
10.0000 mg | ORAL_TABLET | Freq: Every day | ORAL | Status: DC
  Administered 2022-11-21: 10 mg via ORAL
  Filled 2022-11-20: qty 1

## 2022-11-20 MED ORDER — ACETAMINOPHEN 500 MG PO TABS
1000.0000 mg | ORAL_TABLET | Freq: Four times a day (QID) | ORAL | Status: DC
  Administered 2022-11-20 – 2022-11-21 (×4): 1000 mg via ORAL
  Filled 2022-11-20 (×4): qty 2

## 2022-11-20 MED ORDER — MAGNESIUM CITRATE PO SOLN: 1.0000 | Freq: Once | ORAL | Status: DC | PRN

## 2022-11-20 MED ORDER — MIDAZOLAM HCL 2 MG/2ML IJ SOLN
INTRAMUSCULAR | Status: DC | PRN
  Administered 2022-11-20: 2 mg via INTRAVENOUS

## 2022-11-20 MED ORDER — ONDANSETRON HCL 4 MG/2ML IJ SOLN
INTRAMUSCULAR | Status: DC | PRN
  Administered 2022-11-20 (×2): 4 mg via INTRAVENOUS

## 2022-11-20 MED ORDER — DEXMEDETOMIDINE HCL IN NACL 200 MCG/50ML IV SOLN
INTRAVENOUS | Status: DC | PRN
  Administered 2022-11-20: 8 ug via INTRAVENOUS

## 2022-11-20 MED ORDER — VANCOMYCIN HCL IN DEXTROSE 1-5 GM/200ML-% IV SOLN
1000.0000 mg | Freq: Once | INTRAVENOUS | Status: AC
  Administered 2022-11-20: 1000 mg via INTRAVENOUS

## 2022-11-20 MED ORDER — SODIUM CHLORIDE 0.9% FLUSH
3.0000 mL | Freq: Two times a day (BID) | INTRAVENOUS | Status: DC
  Administered 2022-11-21: 3 mL via INTRAVENOUS

## 2022-11-20 MED ORDER — SURGIPHOR WOUND IRRIGATION SYSTEM - OPTIME
TOPICAL | Status: DC | PRN
  Administered 2022-11-20: 450 mL via TOPICAL

## 2022-11-20 MED ORDER — ACETAMINOPHEN 10 MG/ML IV SOLN
INTRAVENOUS | Status: AC
  Filled 2022-11-20: qty 100

## 2022-11-20 MED ORDER — PHENYLEPHRINE 80 MCG/ML (10ML) SYRINGE FOR IV PUSH (FOR BLOOD PRESSURE SUPPORT)
PREFILLED_SYRINGE | INTRAVENOUS | Status: DC | PRN
  Administered 2022-11-20 (×2): 80 ug via INTRAVENOUS
  Administered 2022-11-20: 160 ug via INTRAVENOUS
  Administered 2022-11-20: 80 ug via INTRAVENOUS

## 2022-11-20 MED ORDER — FENTANYL CITRATE (PF) 100 MCG/2ML IJ SOLN
INTRAMUSCULAR | Status: AC
  Filled 2022-11-20: qty 2

## 2022-11-20 MED ORDER — SODIUM CHLORIDE FLUSH 0.9 % IV SOLN
INTRAVENOUS | Status: AC
  Filled 2022-11-20: qty 20

## 2022-11-20 MED ORDER — OXYCODONE HCL 5 MG PO TABS
5.0000 mg | ORAL_TABLET | Freq: Once | ORAL | Status: AC | PRN
  Administered 2022-11-20: 5 mg via ORAL

## 2022-11-20 MED ORDER — FAMOTIDINE 20 MG PO TABS: 10.0000 mg | ORAL_TABLET | ORAL | Status: DC | PRN

## 2022-11-20 MED ORDER — SODIUM CHLORIDE (PF) 0.9 % IJ SOLN
INTRAMUSCULAR | Status: DC | PRN
  Administered 2022-11-20: 60 mL via INTRAMUSCULAR

## 2022-11-20 MED ORDER — MORPHINE SULFATE (PF) 2 MG/ML IV SOLN: 2.0000 mg | INTRAVENOUS | Status: AC | PRN

## 2022-11-20 MED ORDER — OXYCODONE HCL 5 MG PO TABS
ORAL_TABLET | ORAL | Status: AC
  Filled 2022-11-20: qty 1

## 2022-11-20 MED ORDER — ONDANSETRON HCL 4 MG PO TABS: 4.0000 mg | ORAL_TABLET | Freq: Four times a day (QID) | ORAL | Status: DC | PRN

## 2022-11-20 MED ORDER — ENOXAPARIN SODIUM 40 MG/0.4ML IJ SOSY
40.0000 mg | PREFILLED_SYRINGE | INTRAMUSCULAR | Status: DC
  Administered 2022-11-21: 40 mg via SUBCUTANEOUS
  Filled 2022-11-20: qty 0.4

## 2022-11-20 MED ORDER — DEXAMETHASONE SODIUM PHOSPHATE 10 MG/ML IJ SOLN
INTRAMUSCULAR | Status: DC | PRN
  Administered 2022-11-20: 10 mg via INTRAVENOUS

## 2022-11-20 MED ORDER — SENNA 8.6 MG PO TABS
1.0000 | ORAL_TABLET | Freq: Two times a day (BID) | ORAL | Status: DC
  Administered 2022-11-20 – 2022-11-21 (×2): 8.6 mg via ORAL
  Filled 2022-11-20 (×2): qty 1

## 2022-11-20 MED ORDER — SODIUM CHLORIDE 0.9 % IV SOLN: 250.0000 mL | INTRAVENOUS | Status: DC

## 2022-11-20 MED ORDER — LIDOCAINE HCL (CARDIAC) PF 100 MG/5ML IV SOSY
PREFILLED_SYRINGE | INTRAVENOUS | Status: DC | PRN
  Administered 2022-11-20: 100 mg via INTRAVENOUS

## 2022-11-20 MED ORDER — REMIFENTANIL HCL 1 MG IV SOLR
INTRAVENOUS | Status: DC | PRN
  Administered 2022-11-20: .2 ug/kg/min via INTRAVENOUS

## 2022-11-20 MED ORDER — METHOCARBAMOL 500 MG PO TABS
500.0000 mg | ORAL_TABLET | Freq: Four times a day (QID) | ORAL | Status: DC | PRN
  Administered 2022-11-20 – 2022-11-21 (×2): 500 mg via ORAL
  Filled 2022-11-20 (×2): qty 1

## 2022-11-20 MED ORDER — SUCCINYLCHOLINE CHLORIDE 200 MG/10ML IV SOSY
PREFILLED_SYRINGE | INTRAVENOUS | Status: DC | PRN
  Administered 2022-11-20: 140 mg via INTRAVENOUS

## 2022-11-20 MED ORDER — OXYCODONE HCL 5 MG PO TABS
5.0000 mg | ORAL_TABLET | ORAL | Status: DC | PRN
  Administered 2022-11-21: 5 mg via ORAL
  Filled 2022-11-20: qty 1

## 2022-11-20 MED ORDER — HYDROMORPHONE HCL 1 MG/ML IJ SOLN
0.2500 mg | INTRAMUSCULAR | Status: DC | PRN
  Administered 2022-11-20: 0.25 mg via INTRAVENOUS

## 2022-11-20 MED ORDER — DOCUSATE SODIUM 100 MG PO CAPS
100.0000 mg | ORAL_CAPSULE | Freq: Two times a day (BID) | ORAL | Status: DC
  Administered 2022-11-20 – 2022-11-21 (×2): 100 mg via ORAL
  Filled 2022-11-20 (×2): qty 1

## 2022-11-20 MED ORDER — OXYCODONE HCL 5 MG PO TABS
10.0000 mg | ORAL_TABLET | ORAL | Status: DC | PRN
  Administered 2022-11-20 (×2): 10 mg via ORAL
  Filled 2022-11-20 (×3): qty 2

## 2022-11-20 MED ORDER — KETOROLAC TROMETHAMINE 15 MG/ML IJ SOLN
15.0000 mg | Freq: Four times a day (QID) | INTRAMUSCULAR | Status: AC
  Administered 2022-11-20 – 2022-11-21 (×4): 15 mg via INTRAVENOUS
  Filled 2022-11-20 (×4): qty 1

## 2022-11-20 MED ORDER — CEFAZOLIN SODIUM-DEXTROSE 2-4 GM/100ML-% IV SOLN
INTRAVENOUS | Status: AC
  Filled 2022-11-20: qty 100

## 2022-11-20 MED ORDER — PROPOFOL 10 MG/ML IV BOLUS
INTRAVENOUS | Status: DC | PRN
  Administered 2022-11-20: 200 mg via INTRAVENOUS

## 2022-11-20 MED ORDER — SURGIFLO WITH THROMBIN (HEMOSTATIC MATRIX KIT) OPTIME
TOPICAL | Status: DC | PRN
  Administered 2022-11-20: 1 via TOPICAL

## 2022-11-20 MED ORDER — POLYETHYLENE GLYCOL 3350 17 G PO PACK: 17.0000 g | PACK | Freq: Every day | ORAL | Status: DC | PRN

## 2022-11-20 MED ORDER — CHLORHEXIDINE GLUCONATE 0.12 % MT SOLN
OROMUCOSAL | Status: AC
  Filled 2022-11-20: qty 15

## 2022-11-20 MED ORDER — VANCOMYCIN HCL IN DEXTROSE 1-5 GM/200ML-% IV SOLN
INTRAVENOUS | Status: AC
  Filled 2022-11-20: qty 200

## 2022-11-20 MED ORDER — HYDROMORPHONE HCL 1 MG/ML IJ SOLN
INTRAMUSCULAR | Status: DC | PRN
  Administered 2022-11-20: 1 mg via INTRAVENOUS

## 2022-11-20 MED ORDER — MENTHOL 3 MG MT LOZG: 1.0000 | LOZENGE | OROMUCOSAL | Status: DC | PRN

## 2022-11-20 MED ORDER — PHENYLEPHRINE HCL-NACL 20-0.9 MG/250ML-% IV SOLN
INTRAVENOUS | Status: DC | PRN
  Administered 2022-11-20: 25 ug/min via INTRAVENOUS
  Administered 2022-11-20: 40 ug/min via INTRAVENOUS

## 2022-11-20 MED ORDER — PHENOL 1.4 % MT LIQD: 1.0000 | OROMUCOSAL | Status: DC | PRN

## 2022-11-20 SURGICAL SUPPLY — 90 items
ADH SKN CLS APL DERMABOND .7 (GAUZE/BANDAGES/DRESSINGS) ×9
AGENT HMST KT MTR STRL THRMB (HEMOSTASIS) ×3
ALLOGRAFT BONE FIBER KORE 5 (Bone Implant) IMPLANT
APL PRP STRL LF DISP 70% ISPRP (MISCELLANEOUS) ×6
BASIN KIT SINGLE STR (MISCELLANEOUS) ×3 IMPLANT
BUR NEURO DRILL SOFT 3.0X3.8M (BURR) ×3 IMPLANT
CHLORAPREP W/TINT 26 (MISCELLANEOUS) ×6 IMPLANT
CNTNR URN SCR LID CUP LEK RST (MISCELLANEOUS) ×3 IMPLANT
CONT SPEC 4OZ STRL OR WHT (MISCELLANEOUS) ×3
CORD BIP STRL DISP 12FT (MISCELLANEOUS) ×3 IMPLANT
CORD LIGHT LATERIAL X LIFT (MISCELLANEOUS) IMPLANT
COVER BACK TABLE REUSABLE LG (DRAPES) ×3 IMPLANT
COVERAGE SUPP BRAINLAB NG SPNE (MISCELLANEOUS) IMPLANT
COVERAGE SUPPORT SPINE BRAINLB (MISCELLANEOUS)
DERMABOND ADVANCED .7 DNX12 (GAUZE/BANDAGES/DRESSINGS) ×9 IMPLANT
DRAPE C ARM PK CFD 31 SPINE (DRAPES) ×3 IMPLANT
DRAPE C-ARMOR (DRAPES) IMPLANT
DRAPE HD 5FT BACK TABLE (DRAPES) IMPLANT
DRAPE INCISE IOBAN 66X45 STRL (DRAPES) ×3 IMPLANT
DRAPE LAPAROTOMY 100X77 ABD (DRAPES) ×6 IMPLANT
DRAPE MICROSCOPE SPINE 48X150 (DRAPES) IMPLANT
DRAPE POUCH INSTRU U-SHP 10X18 (DRAPES) ×3 IMPLANT
DRAPE SCAN PATIENT (DRAPES) ×3 IMPLANT
DRSG OPSITE POSTOP 4X6 (GAUZE/BANDAGES/DRESSINGS) IMPLANT
DRSG TEGADERM 2-3/8X2-3/4 SM (GAUZE/BANDAGES/DRESSINGS) IMPLANT
DRSG TEGADERM 4X4.75 (GAUZE/BANDAGES/DRESSINGS) IMPLANT
DRSG TEGADERM 6X8 (GAUZE/BANDAGES/DRESSINGS) IMPLANT
ELECT EZSTD 165MM 6.5IN (MISCELLANEOUS) ×3
ELECT REM PT RETURN 9FT ADLT (ELECTROSURGICAL) ×6
ELECTRODE EZSTD 165MM 6.5IN (MISCELLANEOUS) ×3 IMPLANT
ELECTRODE REM PT RTRN 9FT ADLT (ELECTROSURGICAL) ×6 IMPLANT
EX-PIN ORTHOLOCK NAV 4X150 (PIN) ×3 IMPLANT
FEE CVG SUPP BRAINLAB NG SPNE (MISCELLANEOUS) IMPLANT
FEE INTRAOP CADWELL SUPPLY NCS (MISCELLANEOUS) ×3 IMPLANT
FEE INTRAOP MONITOR IMPULS NCS (MISCELLANEOUS) ×3 IMPLANT
FORCEPS BPLR BAYO 10IN 1.0TIP (ORTHOPEDIC DISPOSABLE SUPPLIES) IMPLANT
GAUZE 4X4 16PLY ~~LOC~~+RFID DBL (SPONGE) IMPLANT
GAUZE SPONGE 2X2 STRL 8-PLY (GAUZE/BANDAGES/DRESSINGS) IMPLANT
GLOVE BIOGEL PI IND STRL 6.5 (GLOVE) ×9 IMPLANT
GLOVE SURG SYN 6.5 ES PF (GLOVE) ×15 IMPLANT
GLOVE SURG SYN 6.5 PF PI (GLOVE) ×15 IMPLANT
GLOVE SURG SYN 8.5 E (GLOVE) ×18 IMPLANT
GLOVE SURG SYN 8.5 PF PI (GLOVE) ×18 IMPLANT
GOWN SRG LRG LVL 4 IMPRV REINF (GOWNS) ×6 IMPLANT
GOWN SRG XL LVL 3 NONREINFORCE (GOWNS) ×6 IMPLANT
GOWN STRL NON-REIN TWL XL LVL3 (GOWNS) ×6
GOWN STRL REIN LRG LVL4 (GOWNS) ×6
GUIDEWIRE NITINOL BEVEL TIP (WIRE) IMPLANT
HOLDER FOLEY CATH W/STRAP (MISCELLANEOUS) ×3 IMPLANT
INTRAOP CADWELL SUPPLY FEE NCS (MISCELLANEOUS)
INTRAOP DISP SUPPLY FEE NCS (MISCELLANEOUS)
INTRAOP MONITOR FEE IMPULS NCS (MISCELLANEOUS)
KIT DILATOR XLIF 5 (KITS) IMPLANT
KIT DISP MARS 3V (KITS) IMPLANT
KIT SPINAL PRONEVIEW (KITS) ×3 IMPLANT
KIT TURNOVER KIT A (KITS) ×3 IMPLANT
KNIFE BAYONET SHORT DISCETOMY (MISCELLANEOUS) IMPLANT
MANIFOLD NEPTUNE II (INSTRUMENTS) ×6 IMPLANT
MARKER SKIN DUAL TIP RULER LAB (MISCELLANEOUS) ×6 IMPLANT
MARKER SPHERE PSV REFLC 13MM (MARKER) ×21 IMPLANT
MODULE NVM5 NEXT GEN EMG (NEUROSURGERY SUPPLIES) IMPLANT
NDL SAFETY ECLIP 18X1.5 (MISCELLANEOUS) ×3 IMPLANT
NS IRRIG 1000ML POUR BTL (IV SOLUTION) ×3 IMPLANT
PACK LAMINECTOMY ARMC (PACKS) ×3 IMPLANT
PAD ARMBOARD 7.5X6 YLW CONV (MISCELLANEOUS) ×3 IMPLANT
PENCIL SMOKE EVACUATOR (MISCELLANEOUS) ×3 IMPLANT
ROD RELINE MAS TI LORD 5.5X40 (Rod) IMPLANT
SCREW LOCK RELINE 5.5 TULIP (Screw) IMPLANT
SCREW MAS RED RELINE 6.5X55 (Screw) IMPLANT
SCREW RELINE RED 6.5X45MM POLY (Screw) IMPLANT
SCREW RELINE RED 6.5X50MM POLY (Screw) IMPLANT
SOLUTION IRRIG SURGIPHOR (IV SOLUTION) ×3 IMPLANT
SPACER HEDRON 18X55X13 10D (Spacer) IMPLANT
STAPLER SKIN PROX 35W (STAPLE) IMPLANT
SURGIFLO W/THROMBIN 8M KIT (HEMOSTASIS) ×3 IMPLANT
SUT DVC VLOC 3-0 CL 6 P-12 (SUTURE) ×3 IMPLANT
SUT ETHILON 3-0 FS-10 30 BLK (SUTURE)
SUT VIC AB 0 CT1 27 (SUTURE) ×6
SUT VIC AB 0 CT1 27XCR 8 STRN (SUTURE) ×3 IMPLANT
SUT VIC AB 2-0 CT1 18 (SUTURE) ×3 IMPLANT
SUTURE EHLN 3-0 FS-10 30 BLK (SUTURE) IMPLANT
SYR 30ML LL (SYRINGE) ×6 IMPLANT
SYR 3ML LL SCALE MARK (SYRINGE) ×3 IMPLANT
TAPE CLOTH 3X10 WHT NS LF (GAUZE/BANDAGES/DRESSINGS) ×12 IMPLANT
TOWEL OR 17X26 4PK STRL BLUE (TOWEL DISPOSABLE) ×6 IMPLANT
TRAP FLUID SMOKE EVACUATOR (MISCELLANEOUS) ×3 IMPLANT
TRAY FOLEY SLVR 16FR LF STAT (SET/KITS/TRAYS/PACK) IMPLANT
TUBING CONNECTING 10 (TUBING) ×3 IMPLANT
WATER STERILE IRR 1000ML POUR (IV SOLUTION) ×6 IMPLANT
WATER STERILE IRR 500ML POUR (IV SOLUTION) IMPLANT

## 2022-11-20 NOTE — Op Note (Signed)
Indications: [name] is a 57 y.o. male with Spondylolisthesis of lumbar region , Spinal instability, lumbar , Chronic bilateral low back pain with bilateral sciatica , Lumbar radiculopathy   He failed conservative management prompting surgical intervention.  Findings: expansion of disc space  Preoperative Diagnosis: Spondylolisthesis of lumbar region , Spinal instability, lumbar , Chronic bilateral low back pain with bilateral sciatica , Lumbar radiculopathy  Postoperative Diagnosis: same   EBL: 50 ml IVF: see anesthesia record Drains: none Disposition: Extubated and Stable to PACU Complications: none  A foley catheter was placed.   Preoperative Note:   Risks of surgery discussed include: infection, bleeding, stroke, coma, death, paralysis, CSF leak, nerve/spinal cord injury, numbness, tingling, weakness, complex regional pain syndrome, recurrent stenosis and/or disc herniation, vascular injury, development of instability, neck/back pain, need for further surgery, persistent symptoms, development of deformity, and the risks of anesthesia. The patient understood these risks and agreed to proceed.  NAME OF ANTERIOR PROCEDURE:               1. Anterior lumbar interbody fusion via a right lateral retroperitoneal approach at L4/5 2. Placement of a Lordotic Globus Hedron interbody cage, filled with Demineralized Bone Matrix  NAME OF POSTERIOR PROCEDURE 1. Posterior instrumentation using Nuvasive Reline Instrumentation 2. Posterolateral fusion, L4/5, utilizing demineralized bone matrix 3. Use of Stereotaxis 4. R L5/S1 microdiscectomy through a separate incision   PROCEDURE:  Patient was brought to the operating room, intubated, turned to the lateral position.  All pressure points were checked and double-checked.  The patient was prepped and draped in the standard fashion. Prior to prepping, fluoroscopy was brought in and the patient was positioned with a large bump under the  contralateral side between the iliac crest and rib cage, allowing the area between the iliac crest and the lateral aspect of the rib cage to open and increase the ability to reach inferiorly, to facilitate entry into the disc space.  The incision was marked upon the skin both the location of the disc space as well as the superior most aspect of the iliac crest.  Based on the identification of the disc space an incision was prepared, marked upon the skin and eventually was used for our lateral incision.  The fluoroscopy was turned into a cross table A/P image in order to confirm that the patient's spine remained in a perpendicular trajectory to the floor without rotation.  Once confirming that all the pressure points were checked and double-checked and the patient remained in sturdy position strapped down in this slightly jack-knifed lateral position, the patient was prepped and draped in standard fashion.  The skin was injected with local anesthetic, then incised until the abdominal wall fascia was noted.  I bluntly dissected posteriorly until we were able to identify the posterior musculature near petit's triangle.  At this point, using primarily blunt dissection with our finger aided with a metzenbaum scissor, were able to enter the retroperitoneal cavity.  The retroperitoneal potential space was opened further until palpating out the psoas muscle, the medial aspect of the iliac crest, the medial aspect of the last rib and continued to define the retroperitoneal space with blunt dissection in order to facilitate safe placement of our dilators.    While protecting by dissecting directly onto a finger in the retroperitoneum, the retroperitoneal space was entered safely from the lateral incision and the initial dilator placed onto the muscle belly of the psoas.  While directly stimulating the dilator and after radiographically confirming  our location relative to the disc space, I placed the dilator through the  psoas.  The dilators were stimulated to ensure remaining safely away from any of the lumbar plexus nerves; the dilators were repositioned until no pathologic stimulation was appreciated.  Once I had confirmed the location of our initial dilator radiographically, a K-wire secured the dilator into the L4/5 disc space and confirmed position under A/P and lateral fluoroscopy.  At this point, I dilated up with direct stimulation to confirm lack of pathologic stimulation.  Once all the dilators were in position, I placed in the retractor and secured it onto the table, locked into position and confirmed under A/P and lateral fluoroscopy to confirm our approach angle to the disc space as well as location relative to the disc space.  I then placed the muscle stimulator in through the working channel down to the vertebral body, stimulating the entire lateral surface of the vertebral body and any of the visualized psoas muscle that was adjacent to the retractor, confirming again the safe passage to the psoas before we began performing the discectomy.  At this point, we began our discectomy at L4/5.  The disc was incised laterally throughout the extent of our exposure. Using a combination of pituitary rongeurs, Kerrison rongeurs, rasps, curettes of various sorts, we were able to begin to clean out the disc space.  Once we had cleaned out the majority of the disc space, we then cut the lateral annulus with a cob, breaking the lateral annual attachments on the contralateral side by subtly working the cob through the annulus while using flouroscopy.  Care was taken not to extend further than required after cutting the annular attachments.  After this had been performed, we prepared the endplates for placement of our graft, sized a graft to the disc space by serially dilating up in trial sizes until we confirmed that our graft would be well positioned, allowing distraction while maintaining good grip.  This was confirmed under  A/P and lateral fluoroscopy in order to ensure its placement as an eventual trial for placement of our final graft.  We irrigated with bacteriostatic saline.  Once confirmed placement, the Hedron implant filled with allograft was impacted into position at L4/5.   Through a combination of intradiscal distraction and anterior releasing, we were able to correct the anterior deformity during disc preparation and placement of the graft.  At this point, final radiographs were performed, and we began closure.  The wound was closed using 0 Vicryl interrupted suture in the fascia and 2-0 Vicryl inverted suture were placed in the subcutaneous tissue and dermis. 3-0 monocryl was used for final closure. Dermabond was used to close the skin.    After closing the anterior part in layers, the patient was repositioned into prone position.  All pressure points were checked and double-checked.  The posterior operative site was prepped and draped in standard fashion.  The stereotactic array was placed.  Stereotactic images were acquired using intraoperative CT scanning.  This was registered to the patient.  Using stereotaxis, screw trajectories were planned and incisions made.  The pedicles from L4 to L5 were cannulated bilaterally and K wires used to secure the tracks.  We then utilized a stereotactic screwdriver to place pedicle screws from L4 to L5.  At each level, Nuvasive Reline pedicle screws were placed.  Once the screws were placed, the screw extensions were then linked, a path was formed for the rod and a rod was utilized to connect  the screws.  We then compressed, torqued / counter-torqued and removed the screw assembly. Once performed on each side, the C-arm was brought back and to take confirmatory CT scan showing appropriate placement of all instrumentation and anatomic alignment.    Posterolateral arthrodesis was performed at L4-L5 utilizing demineralized bone matrix.  At this point, we moved to the  microdiscectomy.    The stereotactic imaging system was used to plan an incision and approach to L5/S1.  The Metrx tubes were sequentially advanced under lateral fluoroscopy until a 18 x 60 mm Metrx tube was placed over the right L5/S1 facet and lamina and secured to the bed.    The microscope was then sterilely brought into the field and muscle creep was hemostased with a bipolar and resected with a pituitary rongeur.  A Bovie extender was then used to expose the spinous process and lamina.  Careful attention was placed to not violate the facet capsule. A 3 mm matchstick drill bit was then used to make a hemi-laminotomy trough until the ligamentum flavum was exposed.  This was extended to the base of the spinous process.  Once this was complete and the underlying ligamentum flavum was visualized, the ligamentum was dissected with an up angle curette and resected with a #2 and #3 mm biting Kerrison.  The laminotomy opening was also expanded in similar fashion and hemostasis was obtained with Surgifoam and a patty as well as bone wax.  The rostral aspect of the caudal level of the lamina was also resected with a #2 biting Kerrison effort to further enhance exposure.  Once the underlying dura was visualized a Penfield 4 was then used to dissect and expose the traversing nerve root.  Once this was identified a nerve root retractor suction was used to mobilize this medially.  The venous plexus was hemostased with Surgifoam and light bipolar use.  A small penfied was then used to make a small annulotomy within the disc space and disc space contents were noted to come through the annulus.  A portion of the disc herniation was calcified and difficult to remove.  The disc herniation was identified and dissected free using a balltip probe. The pituitary rongeur was used to remove the extruded disc fragments. Once the thecal sac and nerve root were noted to be relaxed and under less tension the ball-tipped feeler was  passed along the foramen distally to ensure no residual compression was noted.    The area was irrigated. The tube system was then removed under microscopic visualization and hemostasis was obtained with a bipolar.    The fascial layer was reapproximated with the use of a 0- Vicryl suture.  Subcutaneous tissue layer was reapproximated using 2-0 Vicryl suture.  3-0 monocryl was used on the skin.  The posterior fusion wounds were closed using 0 Vicryl interrupted suture in the fascia, 2-0 Vicryl inverted suture were placed in the subcutaneous tissue and dermis. 3-0 monocryl was used for final closure. Dermabond was used to close the skin.    Needle, lap and all counts were correct at the end of the case.    There was no pathologic change in the neuromonitoring during the procedure.   Manning Charity PA assisted in the entire procedure. An assistant was required for this procedure due to the complexity.  The assistant provided assistance in tissue manipulation and suction, and was required for the successful and safe performance of the procedure. I performed the critical portions of the procedure.   [employer]  Myer Haff MD Neurosurgery

## 2022-11-20 NOTE — Transfer of Care (Signed)
Immediate Anesthesia Transfer of Care Note  Patient: Timothy Gilmore  Procedure(s) Performed: L4-5 LATERAL LUMBAR INTERBODY FUSION AND POSTERIOR SPINAL FUSION (Spine Lumbar) RIGHT L5-S1 MICRODISCECTOMY (Right: Spine Lumbar) APPLICATION OF INTRAOPERATIVE CT SCAN  Patient Location: PACU  Anesthesia Type:General  Level of Consciousness: awake, drowsy, and patient cooperative  Airway & Oxygen Therapy: Patient Spontanous Breathing and Patient connected to face mask oxygen  Post-op Assessment: Report given to RN and Post -op Vital signs reviewed and stable  Post vital signs: Reviewed and stable  Last Vitals:  Vitals Value Taken Time  BP 123/91 11/20/22 1440  Temp    Pulse 101 11/20/22 1444  Resp 15 11/20/22 1444  SpO2 94 % 11/20/22 1444  Vitals shown include unfiled device data.  Last Pain:  Vitals:   11/20/22 0942  TempSrc: Temporal  PainSc: 3          Complications: No notable events documented.

## 2022-11-20 NOTE — H&P (Signed)
Referring Physician:  Venetia Night, MD [address] [place],  [place] [address]  Primary Physician:  Rayetta Humphrey, MD  History of Present Illness: 11/20/2022 Timothy Gilmore presents with continued symptoms.    09/21/2022 Timothy Gilmore is here today with a chief complaint of low back pain that radiates into the left buttock and posterior leg and stops at the knee, but will occasionally go into the ankle.  He has also had some pain into his right buttock and extending down the posterior aspect of his thigh.  He has had worsening pain in his back and legs for many years.  I saw him back in October 2017 with similar symptoms.  His symptoms have only worsened.  Standing and walking make his symptoms worse.  He sits or changes positions to help with the symptoms.  His symptoms are now causing significant impact on his daily life.  He has difficulty walking and going about his activities of daily living.    Bowel/Bladder Dysfunction: none  Conservative measures:  Physical therapy:  has participated in at [facility] from 08/24/22 to 09/18/22 and was discharged.  Multimodal medical therapy including regular antiinflammatories:  naproxen Injections:  has not received epidural steroid injections  Past Surgery: denies  Timothy Gilmore has no symptoms of cervical myelopathy.  The symptoms are causing a significant impact on the patient's life.   Progress Note from [place], [place] on 07/27/22:   History of Present Illness: 07/27/2022 Timothy Gilmore has a history of GERD, obesity, venous stasis right LE, and fatty liver.    Last seen by Dr. Myer Haff in 2017 for slip L4-L5 with severe central stenosis along with disc at L5-S1 on right side. He was scheduled for L4-S1 TLIF with PSF. This was not done.    He has intermittent (but more frequent) left sided LBP with left  posterior leg pain to knee and sometimes to ankle. Pain is worse when he is  any position for over 45 minutes. Some relief with changing positions and grocery cart. Pain is worse as the day progresses. He has numbness in both feet. No weakness in legs.   I have utilized the care everywhere function in epic to review the outside records available from external health systems.  Review of Systems:  A 10 point review of systems is negative, except for the pertinent positives and negatives detailed in the HPI.  Past Medical History: Past Medical History:  Diagnosis Date   Abdominal hernia    Chronic bilateral low back pain with bilateral sciatica    Degenerative joint disease of knee    Fatty liver    Fractures, stress    GERD (gastroesophageal reflux disease)    Hypertension    Obesity    Spinal instability, lumbar    Spondylolisthesis of lumbar region    Venous stasis dermatitis of right lower extremity    Vitamin D insufficiency     Past Surgical History: Past Surgical History:  Procedure Laterality Date   COLONOSCOPY  2019   FOOT SURGERY     FRACTURE SURGERY     HERNIA REPAIR     Laser vein surgery Right 2011   REPLACEMENT TOTAL KNEE     right 2009, left 2011   SEPTOPLASTY      Allergies: Allergies as of 10/13/2022 - Review Complete 09/21/2022  Allergen Reaction Noted   Penicillins Rash 11/03/2011    Medications:  Current Facility-Administered Medications:    ceFAZolin (  ANCEF) IVPB 3g/100 mL premix, 3 g, Intravenous, Once, Venetia Night, MD   lactated ringers infusion, , Intravenous, Continuous, Lenard Simmer, MD, Last Rate: 10 mL/hr at 11/20/22 1013, New Bag at 11/20/22 1013   vancomycin (VANCOCIN) IVPB 1000 mg/200 mL premix, 1,000 mg, Intravenous, Once, Venetia Night, MD  Social History: Social History   Tobacco Use   Smoking status: Never   Smokeless tobacco: Never  Vaping Use   Vaping status: Never Used  Substance Use Topics   Alcohol use: Yes   Drug use: Never    Family Medical History: History reviewed. No  pertinent family history.  Physical Examination: Vitals:   11/20/22 0942  BP: (!) 140/104  Pulse: (!) 114  Resp: 16  Temp: 98.9 F (37.2 C)  SpO2: 95%   Heart sounds normal no MRG. Chest Clear to Auscultation Bilaterally.  General: Patient is in no apparent distress. Attention to examination is appropriate.  Neck:   Supple.  Full range of motion.  Respiratory: Patient is breathing without any difficulty.   NEUROLOGICAL:     Awake, alert, oriented to person, place, and time.  Speech is clear and fluent.   Cranial Nerves: Pupils equal round and reactive to light.  Facial tone is symmetric.  Facial sensation is symmetric. Shoulder shrug is symmetric. Tongue protrusion is midline.  There is no pronator drift.  Strength: Side Biceps Triceps Deltoid Interossei Grip Wrist Ext. Wrist Flex.  R 5 5 5 5 5 5 5   L 5 5 5 5 5 5 5    Side Iliopsoas Quads Hamstring PF DF EHL  R 5 5 5 5 5 5   L 5 5 5 5 5 5    Reflexes are 1+ and symmetric at the biceps, triceps, brachioradialis, patella and achilles.   Hoffman's is absent.   Bilateral upper and lower extremity sensation is intact to light touch.    No evidence of dysmetria noted.  Gait is antalgic.  Straight leg raise is positive on the right at 45 degrees.     Medical Decision Making  Imaging: MRI L spine 07/26/2022 IMPRESSION: 1. Mildly progressive multilevel spondylosis compared with previous MRI from 2017. 2. At L4-5, there is severe multifactorial spinal stenosis with moderate lateral recess and foraminal narrowing bilaterally. 3. At L5-S1, there is chronic right S1 nerve root encroachment by a chronic right paracentral disc protrusion. Moderate foraminal narrowing bilaterally appears mildly progressive. 4. No acute findings or significant changes at the other levels.     Electronically Signed   By: Carey Bullocks M.D.   On: 07/26/2022 12:33  I have personally reviewed the images and agree with the above  interpretation.  Assessment and Plan: Timothy Gilmore is a pleasant 57 y.o. male with spondylolisthesis at L4-5 with lumbar spinal instability as demonstrated on flexion-extension radiographs with a 6 mm change in L4 and L5 position.  He has back pain with radicular symptoms including sciatica.  He has symptoms concerning for right-sided S1 radiculopathy as well.  He has tried and failed conservative management including physical therapy and medications.  At this point, I recommend surgical intervention.  Given the instability at L4-5, we will proceed with L4-5 lateral lumbar interbody fusion to address his disc height loss and anterolisthesis with percutaneous fixation posteriorly.  We will also perform right-sided L5-S1 microdiscectomy to address his S1 nerve root compression.      Srikar Chiang K. Myer Haff MD, [facility] Neurosurgery

## 2022-11-20 NOTE — Anesthesia Procedure Notes (Signed)
Procedure Name: General with mask airway Date/Time: 11/20/2022 11:20 AM  Performed by: Mohammed Kindle, CRNAPre-anesthesia Checklist: Patient identified, Emergency Drugs available, Suction available and Patient being monitored Patient Re-evaluated:Patient Re-evaluated prior to induction Oxygen Delivery Method: Circle system utilized Preoxygenation: Pre-oxygenation with 100% oxygen Induction Type: IV induction Laryngoscope Size: McGraph and 3 Grade View: Grade I Tube type: Oral Tube size: 7.0 mm Placement Confirmation: positive ETCO2, CO2 detector, breath sounds checked- equal and bilateral and ETT inserted through vocal cords under direct vision Tube secured with: Tape Dental Injury: Teeth and Oropharynx as per pre-operative assessment

## 2022-11-20 NOTE — Anesthesia Preprocedure Evaluation (Signed)
Anesthesia Evaluation  Patient identified by MRN, date of birth, ID band Patient awake    Reviewed: Allergy & Precautions, NPO status , Patient's Chart, lab work & pertinent test results  History of Anesthesia Complications Negative for: history of anesthetic complications  Airway Mallampati: III  TM Distance: >3 FB Neck ROM: full    Dental no notable dental hx.    Pulmonary neg pulmonary ROS   Pulmonary exam normal        Cardiovascular hypertension, On Medications and On Home Beta Blockers Normal cardiovascular exam     Neuro/Psych  Neuromuscular disease  negative psych ROS   GI/Hepatic Neg liver ROS,GERD  Controlled,,  Endo/Other  negative endocrine ROS    Renal/GU      Musculoskeletal   Abdominal   Peds  Hematology negative hematology ROS (+)   Anesthesia Other Findings Past Medical History: No date: Abdominal hernia No date: Chronic bilateral low back pain with bilateral sciatica No date: Degenerative joint disease of knee No date: Fatty liver No date: Fractures, stress No date: GERD (gastroesophageal reflux disease) No date: Hypertension No date: Obesity No date: Spinal instability, lumbar No date: Spondylolisthesis of lumbar region No date: Venous stasis dermatitis of right lower extremity No date: Vitamin D insufficiency  Past Surgical History: 2019: COLONOSCOPY No date: FOOT SURGERY No date: FRACTURE SURGERY No date: HERNIA REPAIR 2011: Laser vein surgery; Right No date: REPLACEMENT TOTAL KNEE     Comment:  right 2009, left 2011 No date: SEPTOPLASTY  BMI    Body Mass Index: 33.71 kg/m      Reproductive/Obstetrics negative OB ROS                             Anesthesia Physical Anesthesia Plan  ASA: 2  Anesthesia Plan: General ETT   Post-op Pain Management: Toradol IV (intra-op)*, Ofirmev IV (intra-op)* and Dilaudid IV   Induction: Intravenous  PONV  Risk Score and Plan: 2 and Ondansetron, Dexamethasone, Midazolam and Treatment may vary due to age or medical condition  Airway Management Planned: Oral ETT  Additional Equipment:   Intra-op Plan:   Post-operative Plan: Extubation in OR  Informed Consent: I have reviewed the patients History and Physical, chart, labs and discussed the procedure including the risks, benefits and alternatives for the proposed anesthesia with the patient or authorized representative who has indicated his/her understanding and acceptance.     Dental Advisory Given  Plan Discussed with: Anesthesiologist, CRNA and Surgeon  Anesthesia Plan Comments: (Patient consented for risks of anesthesia including but not limited to:  - adverse reactions to medications - damage to eyes, teeth, lips or other oral mucosa - nerve damage due to positioning  - sore throat or hoarseness - Damage to heart, brain, nerves, lungs, other parts of body or loss of life  Patient voiced understanding.)        Anesthesia Quick Evaluation

## 2022-11-20 NOTE — Plan of Care (Signed)

## 2022-11-21 ENCOUNTER — Encounter: Payer: Self-pay | Admitting: Neurosurgery

## 2022-11-21 DIAGNOSIS — M4316 Spondylolisthesis, lumbar region: Secondary | ICD-10-CM | POA: Diagnosis not present

## 2022-11-21 MED ORDER — OXYCODONE HCL 5 MG PO TABS: 5.0000 mg | ORAL_TABLET | ORAL | 0 refills | Status: AC | PRN

## 2022-11-21 MED ORDER — SENNA 8.6 MG PO TABS: 1.0000 | ORAL_TABLET | Freq: Two times a day (BID) | ORAL | 0 refills | Status: DC

## 2022-11-21 MED ORDER — METHOCARBAMOL 500 MG PO TABS: 500.0000 mg | ORAL_TABLET | Freq: Four times a day (QID) | ORAL | 0 refills | Status: DC | PRN

## 2022-11-21 NOTE — TOC Transition Note (Signed)
Transition of Care [facility]) - CM/SW Discharge Note   Patient Details  Name: Timothy Gilmore MRN: [id number] Date of Birth: [birth year]  Transition of Care [facility]) CM/SW Contact:  Garret Reddish, RN Phone Number: 11/21/2022, 12:29 PM   Clinical Narrative:   Chart reviewed.  Patient was admitted for Spondylolisthesis of lumbar region.  Patient is s/p lumbar fusion.    Noted that patient has orders for discharge today. Noted that PT has recommended 2 wheeled rolling walker for home use.    I have asked  John with [employer] to provide patient with 2 wheeled rolling walker for home use.   I have informed staff nurse of the above information.   Patient has no other discharge needs.      Final next level of care: Home/Self Care Barriers to Discharge: No Barriers Identified   Patient Goals and CMS Choice CMS Medicare.gov Compare Post Acute Care list provided to:: Patient Choice offered to / list presented to : Patient  Discharge Placement                      Patient and family notified of of transfer: 11/21/22  Discharge Plan and Services Additional resources added to the After Visit Summary for                  DME Arranged: Walker rolling DME Agency: [employer] Date DME Agency Contacted: 11/21/22 Time DME Agency Contacted: 1000 Representative spoke with at DME Agency: Jonny Ruiz            Social Determinants of Health (SDOH) Interventions SDOH Screenings   Food Insecurity: No Food Insecurity (11/20/2022)  Housing: Low Risk  (11/20/2022)  Transportation Needs: No Transportation Needs (11/20/2022)  Utilities: Not At Risk (11/20/2022)  Financial Resource Strain: Low Risk  (07/21/2022)   [facility]  Tobacco Use: Low Risk  (11/20/2022)     Readmission Risk Interventions     No data to display

## 2022-11-21 NOTE — Evaluation (Signed)
Physical Therapy Evaluation Patient Details Name: Timothy Gilmore MRN: [id number] DOB: [birth year] Today's Date: 11/21/2022  History of Present Illness  Pt is a 57 y.o. male s/p L4-5 XLIF, PSF, and R L5-S1 microdiscectomy.  PMH includes htn, abdominal hernia, chronic B LBP with B sciatica, DJD of knee, stress fx's, spondylolisthesis of lumbar region, venous stasis dermatitis of R LE, Vitamin D insufficiency, B TKR, septoplasty.  Clinical Impression  Prior to surgery, pt was independent with functional mobility; lives alone in 1 level home with 4 STE L railing; pt's sister able to assist pt first couple of days home.  3/10 low back incisional pain during session.  Currently pt is modified independent with transfers; SBA with ambulation 160 feet with RW use; and SBA navigating 4 steps with L railing.  Pt able to verbalize spinal precautions and maintain during session.  Pt steady and safe with functional mobility during session.  Improved gait mechanics and low back comfort noted with use of RW.  Pt would currently benefit from skilled PT to address noted impairments and functional limitations (see below for any additional details).  Upon hospital discharge, follow up therapy per surgeon recommendations.  MD, PA, TOC, and pt's nurse updated on pt's status.    Assistance Recommended at Discharge Set up Supervision/Assistance  If plan is discharge home, recommend the following:  Can travel by private vehicle  A little help with bathing/dressing/bathroom;Assistance with cooking/housework;Assist for transportation;Help with stairs or ramp for entrance        Equipment Recommendations Rolling walker (2 wheels)  Recommendations for Other Services       Functional Status Assessment Patient has had a recent decline in their functional status and demonstrates the ability to make significant improvements in function in a reasonable and predictable amount of time.     Precautions / Restrictions  Precautions Precautions: Fall;Back Precaution Comments: Per orders: no brace needed Restrictions Weight Bearing Restrictions: No      Mobility  Bed Mobility                    Transfers Overall transfer level: Modified independent Equipment used: None, Rolling walker (2 wheels)               General transfer comment: steady transfer from recliner x2 trials (use of UE's to push up to stand)    Ambulation/Gait Ambulation/Gait assistance: Supervision Gait Distance (Feet): 160 Feet Assistive device: Rolling walker (2 wheels) Gait Pattern/deviations: Step-through pattern       General Gait Details: steady ambulation with RW use (pt also walked 20 feet in room no AD use--pt steady but with slower cadence)  Stairs Stairs: Yes Stairs assistance: Supervision Stair Management: One rail Left Number of Stairs: 4 General stair comments: step to pattern; steady  Wheelchair Mobility     Tilt Bed    Modified Rankin (Stroke Patients Only)       Balance Overall balance assessment: Modified Independent Sitting-balance support: No upper extremity supported, Feet supported Sitting balance-Leahy Scale: Good Sitting balance - Comments: steady reaching within BOS   Standing balance support: No upper extremity supported Standing balance-Leahy Scale: Good Standing balance comment: steady reaching within BOS                             Pertinent Vitals/Pain Pain Assessment Pain Assessment: 0-10 Pain Score: 3  Pain Location: incisional pain Pain Descriptors / Indicators: Tightness Pain Intervention(s): Limited activity within patient's  tolerance, Monitored during session, Premedicated before session, Repositioned Vitals (HR and SpO2 on room air) stable and WFL throughout treatment session.    Home Living Family/patient expects to be discharged to:: Private residence Living Arrangements: Alone Available Help at Discharge: Family;Available  PRN/intermittently (Sister will be able to assist for first couple days) Type of Home: House Home Access: Stairs to enter Entrance Stairs-Rails: Left Entrance Stairs-Number of Steps: 4   Home Layout: One level Home Equipment: Toilet riser;Tub bench      Prior Function Prior Level of Function : Independent/Modified Independent;Working/employed;Driving             Mobility Comments: No recent falls reported.       Hand Dominance   Dominant Hand: Right    Extremity/Trunk Assessment   Upper Extremity Assessment Upper Extremity Assessment: Overall WFL for tasks assessed    Lower Extremity Assessment Lower Extremity Assessment:  (at least 4+/5 B hip flexion; 5/5 B knee flexion/extension and DF)    Cervical / Trunk Assessment Cervical / Trunk Assessment: Back Surgery  Communication   Communication: No difficulties  Cognition Arousal/Alertness: Awake/alert Behavior During Therapy: WFL for tasks assessed/performed Overall Cognitive Status: Within Functional Limits for tasks assessed                                 General Comments: Pt able to verbalize spinal precautions        General Comments  Nursing cleared pt for participation in physical therapy.  Pt agreeable to PT session.    Exercises     Assessment/Plan    PT Assessment Patient needs continued PT services  PT Problem List Decreased strength;Decreased activity tolerance;Decreased balance;Decreased mobility;Decreased knowledge of use of DME;Decreased knowledge of precautions;Decreased skin integrity;Pain       PT Treatment Interventions DME instruction;Gait training;Stair training;Functional mobility training;Therapeutic activities;Therapeutic exercise;Balance training;Patient/family education    PT Goals (Current goals can be found in the Care Plan section)  Acute Rehab PT Goals Patient Stated Goal: to improve pain and mobility PT Goal Formulation: With patient Time For Goal  Achievement: 12/05/22 Potential to Achieve Goals: Good    Frequency 7X/week     Co-evaluation               AM-PAC PT "6 Clicks" Mobility  Outcome Measure Help needed turning from your back to your side while in a flat bed without using bedrails?: None Help needed moving from lying on your back to sitting on the side of a flat bed without using bedrails?: A Little Help needed moving to and from a bed to a chair (including a wheelchair)?: A Little Help needed standing up from a chair using your arms (e.g., wheelchair or bedside chair)?: A Little Help needed to walk in hospital room?: A Little Help needed climbing 3-5 steps with a railing? : A Little 6 Click Score: 19    End of Session Equipment Utilized During Treatment: Gait belt (gait belt up high away from incision) Activity Tolerance: Patient tolerated treatment well Patient left: in chair;with call bell/phone within reach;with chair alarm set Nurse Communication: Mobility status;Precautions PT Visit Diagnosis: Other abnormalities of gait and mobility (R26.89);Pain Pain - Right/Left:  (R/L) Pain - part of body:  (low back)    Time: 2440-1027 PT Time Calculation (min) (ACUTE ONLY): 23 min   Charges:   PT Evaluation $PT Eval Low Complexity: 1 Low PT Treatments $Gait Training: 8-22 mins PT General Charges $$  ACUTE PT VISIT: 1 Visit        Hendricks Limes, PT 11/21/22, 12:54 PM

## 2022-11-21 NOTE — Anesthesia Postprocedure Evaluation (Signed)
Anesthesia Post Note  Patient: Timothy Gilmore  Procedure(s) Performed: L4-5 LATERAL LUMBAR INTERBODY FUSION AND POSTERIOR SPINAL FUSION (Spine Lumbar) RIGHT L5-S1 MICRODISCECTOMY (Right: Spine Lumbar) APPLICATION OF INTRAOPERATIVE CT SCAN  Anesthesia Type: General Anesthetic complications: no   No notable events documented.   Last Vitals:  Vitals:   11/21/22 0334 11/21/22 0748  BP: 117/81 (!) 133/90  Pulse: 87 95  Resp: 16 17  Temp: 36.4 C 36.6 C  SpO2: 99% 97%    Last Pain:  Vitals:   11/21/22 1203  TempSrc:   PainSc: 0-No pain                 Louie Boston

## 2022-11-21 NOTE — Progress Notes (Signed)
   Neurosurgery Progress Note  History: Timothy Gilmore is s/p L4-5 XLIF, PSF and right L5-S1 microdiscectomy  POD1: Improved pre-op leg pain  Physical Exam: Vitals:   11/21/22 0334 11/21/22 0748  BP: 117/81 (!) 133/90  Pulse: 87 95  Resp: 16 17  Temp: 97.6 F (36.4 C) 97.9 F (36.6 C)  SpO2: 99% 97%    AA Ox3 CNI  Strength:5/5 throughout BLE Incisions covered with clean post-op bandage  Data:  Other tests/results: none  Assessment/Plan:  Timothy Gilmore is a 57 year old presenting with lumbar spondylolisthesis and lumbar radiculopathy status post  L4-5 XLIF, PSF and right L5-S1 microdiscectomy with improved preoperative leg pain.  - mobilize - pain control - DVT prophylaxis - PTOT; dispo planning pending therapy eval  Manning Charity PA-C Department of Neurosurgery

## 2022-11-21 NOTE — Discharge Summary (Signed)
Discharge Summary  Patient ID: Timothy Gilmore MRN: [id number] DOB/AGE: 56-Jan-1967 56 y.o.  Admit date: 11/20/2022 Discharge date: 11/21/2022  Admission Diagnoses: Spondylolisthesis of lumbar region , Spinal instability, lumbar , Chronic bilateral low back pain with bilateral sciatica , Lumbar radiculopathy  Discharge Diagnoses:  Principal Problem:   S/P lumbar fusion Active Problems:   Spondylolisthesis of lumbar region   Spinal instability, lumbar   Chronic bilateral low back pain with bilateral sciatica   Lumbar radiculopathy   Discharged Condition: good  Hospital Course:  Phyllis Whitefield is a pleasant 57 year old male presenting with lumbar spondylolisthesis and chronic low back pain with bilateral sciatica.  He underwent an L4-5 XLIF with posterior spinal fusion and right L5-S1 microdiscectomy.  His intraoperative course was uncomplicated.  He was admitted overnight for pain control and therapy evaluation.  His pain was well-controlled on oral pain medication.  He was seen by therapy and deemed appropriate for discharge home with a rolling walker on postop day 1.  He was given prescriptions for oxycodone, Robaxin, and senna to take as needed.  Consults: None  Significant Diagnostic Studies: none  Treatments: surgery: as above.  Please see separately dictated operative report for further details  Discharge Exam: Blood pressure (!) 133/90, pulse 95, temperature 97.9 F (36.6 C), temperature source Oral, resp. rate 17, height 6' 1.5" (1.867 m), weight 117.5 kg, SpO2 97%.  AA Ox3 CNI   Strength:5/5 throughout BLE Incisions covered with clean post-op bandage  Disposition: Discharge disposition: 01-Home or Self Care       Discharge Instructions     Incentive spirometry RT   Complete by: As directed       Allergies as of 11/21/2022       Reactions   Penicillins Rash        Medication List     STOP taking these medications    naproxen 500 MG  tablet Commonly known as: NAPROSYN       TAKE these medications    amLODipine 10 MG tablet Commonly known as: NORVASC Take 10 mg by mouth daily.   ascorbic acid 100 MG tablet Commonly known as: VITAMIN C Take by mouth.   cholecalciferol 10 MCG (400 UNIT) Tabs tablet Commonly known as: VITAMIN D3 Take by mouth.   famotidine 10 MG tablet Commonly known as: PEPCID Take 10 mg by mouth as needed for heartburn or indigestion.   Loratadine 10 MG Caps Take by mouth.   methocarbamol 500 MG tablet Commonly known as: ROBAXIN Take 1 tablet (500 mg total) by mouth every 6 (six) hours as needed for muscle spasms.   metoprolol succinate 25 MG 24 hr tablet Commonly known as: TOPROL-XL Take 25 mg by mouth daily.   oxyCODONE 5 MG immediate release tablet Commonly known as: Oxy IR/ROXICODONE Take 1 tablet (5 mg total) by mouth every 4 (four) hours as needed for up to 5 days for moderate pain or severe pain ((score 4 to 6)).   senna 8.6 MG Tabs tablet Commonly known as: SENOKOT Take 1 tablet (8.6 mg total) by mouth 2 (two) times daily.               Durable Medical Equipment  (From admission, onward)           Start     Ordered   11/21/22 1019  For home use only DME Walker rolling  Once       Question Answer Comment  Walker: With 5 Inch Wheels   Patient needs  a walker to treat with the following condition S/P lumbar fusion      11/21/22 1018             Signed: Susanne Borders 11/21/2022, 10:25 AM

## 2022-11-21 NOTE — Progress Notes (Signed)
Discharge instructions reviewed with patient. Pt voiced understanding of instructions. Pt is waiting on walker to be delivered

## 2022-11-21 NOTE — Plan of Care (Signed)
  Problem: Pain Management: Goal: Pain level will decrease Outcome: Progressing   Problem: Education: Goal: Ability to verbalize activity precautions or restrictions will improve Outcome: Progressing   Problem: Education: Goal: Knowledge of General Education information will improve Description: Including pain rating scale, medication(s)/side effects and non-pharmacologic comfort measures Outcome: Progressing   Problem: Elimination: Goal: Will not experience complications related to bowel motility Outcome: Progressing   Problem: Activity: Goal: Risk for activity intolerance will decrease Outcome: Progressing

## 2022-11-21 NOTE — Plan of Care (Signed)

## 2022-11-21 NOTE — Discharge Instructions (Signed)
  Your surgeon has performed an operation on your lumbar spine (low back) to relieve pressure on one or more nerves. Many times, patients feel better immediately after surgery and can "overdo it." Even if you feel well, it is important that you follow these activity guidelines. If you do not let your back heal properly from the surgery, you can increase the chance of hardware complications and/or return of your symptoms. The following are instructions to help in your recovery once you have been discharged from the hospital.  Do not use NSAIDs for 3 months after surgery.   Activity    No bending, lifting, or twisting ("BLT"). Avoid lifting objects heavier than 10 pounds (gallon milk jug).  Where possible, avoid household activities that involve lifting, bending, pushing, or pulling such as laundry, vacuuming, grocery shopping, and childcare. Try to arrange for help from friends and family for these activities while your back heals.  Increase physical activity slowly as tolerated.  Taking short walks is encouraged, but avoid strenuous exercise. Do not jog, run, bicycle, lift weights, or participate in any other exercises unless specifically allowed by your doctor. Avoid prolonged sitting, including car rides.  Talk to your doctor before resuming sexual activity.  You should not drive until cleared by your doctor.  Until released by your doctor, you should not return to work or school.  You should rest at home and let your body heal.   You may shower three days after your surgery.  After showering, lightly dab your incision dry. Do not take a tub bath or go swimming for 3 weeks, or until approved by your doctor at your follow-up appointment.  If you smoke, we strongly recommend that you quit.  Smoking has been proven to interfere with normal healing in your back and will dramatically reduce the success rate of your surgery. Please contact [facility] (800-QUIT-NOW) and use the resources at  www.[facility].com for assistance in stopping smoking.  Surgical Incision   If you have a dressing on your incision, you may remove it three days after your surgery. Keep your incision area clean and dry.  Your incision was closed with Dermabond glue. The glue should begin to peel away within about a week.  Diet            You may return to your usual diet. Be sure to stay hydrated.  When to Contact Us  Although your surgery and recovery will likely be uneventful, you may have some residual numbness, aches, and pains in your back and/or legs. This is normal and should improve in the next few weeks.  However, should you experience any of the following, contact us immediately: New numbness or weakness Pain that is progressively getting worse, and is not relieved by your pain medications or rest Bleeding, redness, swelling, pain, or drainage from surgical incision Chills or flu-like symptoms Fever greater than 101.0 F (38.3 C) Problems with bowel or bladder functions Difficulty breathing or shortness of breath Warmth, tenderness, or swelling in your calf  Contact Information During office hours (Monday-Friday 9 am to 5 pm), please call your physician at [phone] and ask for Kendelyn Jean After hours and weekends, please call [phone] and speak with the neurosurgeon on call For a life-threatening emergency, call 911 

## 2022-11-21 NOTE — Evaluation (Signed)
Occupational Therapy Evaluation Patient Details Name: Timothy Gilmore MRN: [id number] DOB: 1965-09-09 Today's Date: 11/21/2022   History of Present Illness Timothy Gilmore is a 57 y.o. male with Spondylolisthesis of lumbar region, lumbar spinal instability, chronic bilateral low back pain with bilateral sciatica, Lumbar radiculopathy. Pt now s/p  R L5/S1 microdiscectomy and posterolateral fusion, L4/5.   Clinical Impression   Pt seen for OT evaluation this date, POD#1 from lumbar fusion. Prior to hospital admission, pt was independent with mobility, ADL, and IADL. No falls in past 12 months. However, has been having increased difficulty with all aspects of mobility and ADL due to back pain. Pt alone in a single family home with 3-4 steps to enter and L handrails He states his sister will be staying with him during the day for the first few days after DC. Currently pt is at MOD I -supervision level with all aspects of mobility and ADL management. Pt educated in back precautions with handout provided, self care skills, bed mobility and functional transfer training, AE/DME for bathing, dressing, and toileting needs, and home/routines modifications and falls prevention strategies to maximize safety and functional independence while minimizing falls risk and maintaining precautions. Pt verbalized understanding of all education/training provided, and will continue to benefit from OT services to maximize recall/carryover of education and safety upon hospital DC. Do not anticipate the need for follow up OT services after DC.      Recommendations for follow up therapy are one component of a multi-disciplinary discharge planning process, led by the attending physician.  Recommendations may be updated based on patient status, additional functional criteria and insurance authorization.   Assistance Recommended at Discharge PRN  Patient can return home with the following A little help with  bathing/dressing/bathroom    Functional Status Assessment  Patient has had a recent decline in their functional status and demonstrates the ability to make significant improvements in function in a reasonable and predictable amount of time.  Equipment Recommendations  None recommended by OT    Recommendations for Other Services       Precautions / Restrictions Precautions Precautions: Fall Restrictions Weight Bearing Restrictions: No      Mobility Bed Mobility Overal bed mobility: Modified Independent             General bed mobility comments: using log roll technique    Transfers Overall transfer level: Modified independent                 General transfer comment: Uses arms to push up from seated surface      Balance Overall balance assessment: Modified Independent, No apparent balance deficits (not formally assessed)                                         ADL either performed or assessed with clinical judgement   ADL Overall ADL's : Needs assistance/impaired                                       General ADL Comments: Pt functionally limited by LBP, and decreased LB access s/p lumbar sx. Requires SUPERVISION for LB dressing and bed mobility with min cueing for back precautions. He performs standing toilet transfer and toileting independently after initial discussion of peri-care in setting of back precautions.  Vision Baseline Vision/History: 1 Wears glasses Ability to See in Adequate Light: 1 Impaired Patient Visual Report: No change from baseline       Perception     Praxis      Pertinent Vitals/Pain Pain Assessment Pain Assessment: 0-10 Pain Score: 3  Pain Location: Near incision Pain Descriptors / Indicators: Tightness Pain Intervention(s): Limited activity within patient's tolerance     Hand Dominance Right   Extremity/Trunk Assessment Upper Extremity Assessment Upper Extremity Assessment:  Overall WFL for tasks assessed   Lower Extremity Assessment Lower Extremity Assessment: Overall WFL for tasks assessed   Cervical / Trunk Assessment Cervical / Trunk Assessment: Normal   Communication Communication Communication: No difficulties   Cognition Arousal/Alertness: Awake/alert Behavior During Therapy: WFL for tasks assessed/performed Overall Cognitive Status: Within Functional Limits for tasks assessed                                 General Comments: Pleasant, conversational, eager to discuss ADL management in setting of spinal precautions.     General Comments       Exercises Other Exercises Other Exercises: Pt educated on role of OT in acute setting, safety, falls prevention strategies, compression stocking management, ADL management in setting of spinal precations (handout provided), safe use of AE/DME for ADL management, toilet transfer, and bed mobility. Pt able to return demonsrate understanding of all education provided during session.   Shoulder Instructions      Home Living Family/patient expects to be discharged to:: Private residence Living Arrangements: Alone Available Help at Discharge: Family;Available PRN/intermittently (Sister will be coming during the day for the first few days) Type of Home: House Home Access: Stairs to enter [employer] of Steps: 3-4 Entrance Stairs-Rails: Left Home Layout: One level     Bathroom Shower/Tub: Tub/shower unit         Home Equipment: Toilet riser;Tub bench          Prior Functioning/Environment Prior Level of Function : Independent/Modified Independent;Working/employed;Driving                        OT Problem List: Decreased range of motion;Decreased knowledge of use of DME or AE;Decreased knowledge of precautions      OT Treatment/Interventions: Self-care/ADL training;Therapeutic exercise;Therapeutic activities;DME and/or AE instruction;Patient/family  education;Balance training    OT Goals(Current goals can be found in the care plan section) Acute Rehab OT Goals Patient Stated Goal: To go home and get back to riding my recumbent bike. OT Goal Formulation: With patient Time For Goal Achievement: 12/05/22 Potential to Achieve Goals: Good ADL Goals Pt Will Perform Lower Body Dressing: sit to/from stand;with modified independence;with adaptive equipment (independently adhereing to back precautions during ADL management) Pt Will Transfer to Toilet: bedside commode;with modified independence (independently adhereing to back precautions during ADL management) Pt Will Perform Toileting - Clothing Manipulation and hygiene: with modified independence;sit to/from stand;with adaptive equipment (independently adhereing to back precautions during ADL management)  OT Frequency: Min 1X/week    Co-evaluation              AM-PAC OT "6 Clicks" Daily Activity     Outcome Measure Help from another person eating meals?: None Help from another person taking care of personal grooming?: None Help from another person toileting, which includes using toliet, bedpan, or urinal?: None Help from another person bathing (including washing, rinsing, drying)?: A Little Help from another person to  put on and taking off regular upper body clothing?: None Help from another person to put on and taking off regular lower body clothing?: A Little 6 Click Score: 22   End of Session    Activity Tolerance: Patient tolerated treatment well Patient left: in chair;with call bell/phone within reach;with chair alarm set  OT Visit Diagnosis: Other abnormalities of gait and mobility (R26.89)                Time: 4098-1191 OT Time Calculation (min): 35 min Charges:  OT General Charges $OT Visit: 1 Visit OT Treatments $Self Care/Home Management : 8-22 mins  Rockney Ghee, M.S., OTR/L 11/21/22, 10:03 AM

## 2022-11-24 ENCOUNTER — Telehealth: Payer: Self-pay | Admitting: Neurosurgery

## 2022-11-24 NOTE — Telephone Encounter (Signed)
L4-5 XLIF & PSF, Right L5-S1 microdiscectomy on 11/20/22  He is asking if he can start weaning himself off of the oxycodone and take tylenol? He does not like how the oxycodone makes him feel.

## 2022-11-24 NOTE — Telephone Encounter (Signed)
I spoke with the patient and notified him that it is ok for him to take tylenol instead of oxycodone. He verbalized understanding.

## 2022-12-01 NOTE — Progress Notes (Unsigned)
   REFERRING PHYSICIAN:  No referring provider defined for this encounter.  DOS: 11/20/22  L4-5 XLIF with posterior spinal fusion and right L5-S1 microdiscectomy   HISTORY OF PRESENT ILLNESS: Timothy Gilmore is approximately 2 weeks status post L4-5 XLIF with posterior spinal fusion and right L5-S1 microdiscectomy . Was given oxycodone and robaxin on discharge from the hospital.   His preop leg pain is mostly gone. He has some occasional twinges of pain. He has mild LBP. He is not taking oxycodone or robaxin. He is taking prn OTC tylenol.    PHYSICAL EXAMINATION:  General: Patient is well developed, well nourished, calm, collected, and in no apparent distress.   NEUROLOGICAL:  General: In no acute distress.   Awake, alert, oriented to person, place, and time.  Pupils equal round and reactive to light.  Facial tone is symmetric.     Strength:            Side Iliopsoas Quads Hamstring PF DF EHL  R 5 5 5 5 5 5   L 5 5 5 5 5 5    Incisions are c/d/i   ROS (Neurologic):  Negative except as noted above  IMAGING: Nothing new to review.   ASSESSMENT/PLAN:  Shah Charon is doing well s/p above surgery. Treatment options reviewed with patient and following plan made:   - I have advised the patient to lift up to 10 pounds until 6 weeks after surgery (follow up with Dr. Myer Haff).  - Reviewed wound care.  - No bending, twisting, or lifting.  - Continue on current medications including prn OTC tylenol.  - Follow up as scheduled in 4 weeks and prn.   BP was elevated. No symptoms of chest pain, shortness of breath, blurry vision, or headaches. Will recheck at home and call PCP if not improved. If he develops CP, SOB, blurry vision, or headaches, then he will go to ED.     Advised to contact the office if any questions or concerns arise.  Drake Leach PA-C Department of neurosurgery

## 2022-12-04 ENCOUNTER — Ambulatory Visit (INDEPENDENT_AMBULATORY_CARE_PROVIDER_SITE_OTHER): Payer: BC Managed Care – PPO | Admitting: Orthopedic Surgery

## 2022-12-04 ENCOUNTER — Encounter: Payer: Self-pay | Admitting: Orthopedic Surgery

## 2022-12-04 VITALS — BP 162/101 | Temp 98.1°F | Ht 73.5 in | Wt 248.0 lb

## 2022-12-04 DIAGNOSIS — M532X6 Spinal instabilities, lumbar region: Secondary | ICD-10-CM

## 2022-12-04 DIAGNOSIS — Z981 Arthrodesis status: Secondary | ICD-10-CM

## 2022-12-04 DIAGNOSIS — Z09 Encounter for follow-up examination after completed treatment for conditions other than malignant neoplasm: Secondary | ICD-10-CM

## 2022-12-04 DIAGNOSIS — M4316 Spondylolisthesis, lumbar region: Secondary | ICD-10-CM

## 2022-12-04 NOTE — Patient Instructions (Signed)
It was nice to see you today.   I am glad that you are feeling better!   Okay to get incision wet in the shower, do not submerge in pool or hot tub.   Call if any concerns about the incision such as redness, drainage, or fever/chills.   No bending, twisting, or lifting. You can lift up to 10 pounds until your follow up with Dr.  Myer Haff in 4 weeks.   Your blood pressure was elevated today. I want you to recheck it at home and follow up with your PCP if it remains high. If you have any chest pain, shortness of breath, blurry vision, or headaches then you need to go to ED.    Please call with any questions or concerns.   Drake Leach PA-C [phone]

## 2022-12-05 ENCOUNTER — Encounter: Payer: BC Managed Care – PPO | Admitting: Orthopedic Surgery

## 2022-12-29 ENCOUNTER — Other Ambulatory Visit: Payer: Self-pay

## 2022-12-29 DIAGNOSIS — M47816 Spondylosis without myelopathy or radiculopathy, lumbar region: Secondary | ICD-10-CM

## 2022-12-29 DIAGNOSIS — M5416 Radiculopathy, lumbar region: Secondary | ICD-10-CM

## 2022-12-29 DIAGNOSIS — M4316 Spondylolisthesis, lumbar region: Secondary | ICD-10-CM

## 2023-01-02 ENCOUNTER — Ambulatory Visit
Admission: RE | Admit: 2023-01-02 | Discharge: 2023-01-02 | Disposition: A | Discharge status: Home / Self Care | Payer: BC Managed Care – PPO | Source: Ambulatory Visit | Attending: Neurosurgery | Admitting: Neurosurgery

## 2023-01-02 ENCOUNTER — Ambulatory Visit
Admission: RE | Admit: 2023-01-02 | Discharge: 2023-01-02 | Disposition: A | Discharge status: Home / Self Care | Payer: BC Managed Care – PPO | Attending: Neurosurgery | Admitting: Neurosurgery

## 2023-01-02 ENCOUNTER — Ambulatory Visit (INDEPENDENT_AMBULATORY_CARE_PROVIDER_SITE_OTHER): Payer: BC Managed Care – PPO | Admitting: Neurosurgery

## 2023-01-02 ENCOUNTER — Encounter: Payer: Self-pay | Admitting: Neurosurgery

## 2023-01-02 VITALS — BP 134/84 | Ht 73.5 in | Wt 248.0 lb

## 2023-01-02 DIAGNOSIS — M4316 Spondylolisthesis, lumbar region: Secondary | ICD-10-CM | POA: Insufficient documentation

## 2023-01-02 DIAGNOSIS — M5416 Radiculopathy, lumbar region: Secondary | ICD-10-CM

## 2023-01-02 DIAGNOSIS — Z09 Encounter for follow-up examination after completed treatment for conditions other than malignant neoplasm: Secondary | ICD-10-CM

## 2023-01-02 DIAGNOSIS — M47816 Spondylosis without myelopathy or radiculopathy, lumbar region: Secondary | ICD-10-CM | POA: Insufficient documentation

## 2023-01-02 NOTE — Progress Notes (Signed)
   REFERRING PHYSICIAN:  Rayetta Humphrey, Md [address] [place],  [place] [address]  DOS: 11/20/22  L4-5 XLIF with posterior spinal fusion and right L5-S1 microdiscectomy   HISTORY OF PRESENT ILLNESS: Ahron Degner is status post L4-5 XLIF with posterior spinal fusion and right L5-S1 microdiscectomy .    He is doing very well.  He has no pain.    PHYSICAL EXAMINATION:  General: Patient is well developed, well nourished, calm, collected, and in no apparent distress.   NEUROLOGICAL:  General: In no acute distress.   Awake, alert, oriented to person, place, and time.  Pupils equal round and reactive to light.  Facial tone is symmetric.     Strength:            Side Iliopsoas Quads Hamstring PF DF EHL  R 5 5 5 5 5 5   L 5 5 5 5 5 5    Incisions are c/d/i   ROS (Neurologic):  Negative except as noted above  IMAGING: No complications noted  ASSESSMENT/PLAN:  Shyrl Numbers is doing well s/p above surgery.  I am very pleased with his response to surgery.  We reviewed his activity limitations.  Will see him back in clinic in approximately 6 weeks.    Venetia Night MD Department of neurosurgery

## 2023-02-04 ENCOUNTER — Other Ambulatory Visit: Payer: Self-pay | Admitting: Orthopedic Surgery

## 2023-02-04 DIAGNOSIS — Z981 Arthrodesis status: Secondary | ICD-10-CM

## 2023-02-04 DIAGNOSIS — M4316 Spondylolisthesis, lumbar region: Secondary | ICD-10-CM

## 2023-02-04 NOTE — Progress Notes (Unsigned)
   REFERRING PHYSICIAN:  Rayetta Humphrey, Md [address] [place],  [place] [address]  DOS: 11/20/22  L4-5 XLIF with posterior spinal fusion and right L5-S1 microdiscectomy   HISTORY OF PRESENT ILLNESS: Timothy Gilmore was doing very well at his last visit with no pain.   He is doing very well! He has minimal intermittent soreness in his back. He is doing his yardwork and is back walking. He is so happy with his results!  He is not taking robaxin or prn oxycodone.    PHYSICAL EXAMINATION:  General: Patient is well developed, well nourished, calm, collected, and in no apparent distress.   NEUROLOGICAL:  General: In no acute distress.   Awake, alert, oriented to person, place, and time.  Pupils equal round and reactive to light.  Facial tone is symmetric.    Strength:          Side Iliopsoas Quads Hamstring PF DF EHL  R 5 5 5 5 5 5   L 5 5 5 5 5 5    Incisions are well healed.    ROS (Neurologic):  Negative except as noted above  IMAGING: Lumbar xrays dated 02/08/23:  No complications noted.   Radiology report for above xrays not yet available.   ASSESSMENT/PLAN:  Timothy Gilmore is doing well s/p above surgery. Treatment options reviewed with patient and following plan made:   - He can slowly return to activity as tolerated.  - Given note for no lifting over 40 lbs at work until 04/09/23.  - He can restart OTC aleve at 3 months postop (after 02/20/23)- take as directed with food.  - Follow up with Dr. Myer Haff in 6 months with repeat xrays.   Advised to contact the office if any questions or concerns arise.  Drake Leach PA-C Department of neurosurgery

## 2023-02-08 ENCOUNTER — Ambulatory Visit
Admission: RE | Admit: 2023-02-08 | Discharge: 2023-02-08 | Disposition: A | Discharge status: Home / Self Care | Payer: BC Managed Care – PPO | Source: Ambulatory Visit | Attending: Orthopedic Surgery | Admitting: Orthopedic Surgery

## 2023-02-08 ENCOUNTER — Ambulatory Visit (INDEPENDENT_AMBULATORY_CARE_PROVIDER_SITE_OTHER): Payer: BC Managed Care – PPO | Admitting: Orthopedic Surgery

## 2023-02-08 ENCOUNTER — Encounter: Payer: Self-pay | Admitting: Orthopedic Surgery

## 2023-02-08 ENCOUNTER — Ambulatory Visit
Admission: RE | Admit: 2023-02-08 | Discharge: 2023-02-08 | Disposition: A | Discharge status: Home / Self Care | Payer: BC Managed Care – PPO | Attending: Orthopedic Surgery | Admitting: Orthopedic Surgery

## 2023-02-08 VITALS — BP 132/82 | Temp 97.8°F | Ht 73.5 in | Wt 248.0 lb

## 2023-02-08 DIAGNOSIS — M4316 Spondylolisthesis, lumbar region: Secondary | ICD-10-CM

## 2023-02-08 DIAGNOSIS — Z09 Encounter for follow-up examination after completed treatment for conditions other than malignant neoplasm: Secondary | ICD-10-CM

## 2023-02-08 DIAGNOSIS — Z981 Arthrodesis status: Secondary | ICD-10-CM | POA: Diagnosis present

## 2023-08-09 ENCOUNTER — Ambulatory Visit: Payer: BC Managed Care – PPO | Admitting: Neurosurgery

## 2023-08-13 ENCOUNTER — Ambulatory Visit
Admission: RE | Admit: 2023-08-13 | Discharge: 2023-08-13 | Disposition: A | Discharge status: Home / Self Care | Source: Ambulatory Visit | Attending: Neurosurgery | Admitting: Neurosurgery

## 2023-08-13 ENCOUNTER — Other Ambulatory Visit: Payer: Self-pay

## 2023-08-13 ENCOUNTER — Ambulatory Visit
Admission: RE | Admit: 2023-08-13 | Discharge: 2023-08-13 | Disposition: A | Discharge status: Home / Self Care | Attending: Neurosurgery | Admitting: Neurosurgery

## 2023-08-13 DIAGNOSIS — M5442 Lumbago with sciatica, left side: Secondary | ICD-10-CM | POA: Diagnosis present

## 2023-08-13 DIAGNOSIS — G8929 Other chronic pain: Secondary | ICD-10-CM | POA: Diagnosis present

## 2023-08-13 DIAGNOSIS — M5441 Lumbago with sciatica, right side: Secondary | ICD-10-CM | POA: Diagnosis present

## 2023-08-14 ENCOUNTER — Ambulatory Visit: Payer: BC Managed Care – PPO | Admitting: Neurosurgery

## 2023-08-14 VITALS — BP 160/98 | Ht 73.5 in | Wt 248.0 lb

## 2023-08-14 DIAGNOSIS — Z981 Arthrodesis status: Secondary | ICD-10-CM | POA: Diagnosis not present

## 2023-08-14 DIAGNOSIS — M4316 Spondylolisthesis, lumbar region: Secondary | ICD-10-CM

## 2023-08-14 NOTE — Progress Notes (Signed)
   REFERRING PHYSICIAN:  No referring provider defined for this encounter.  DOS: 11/20/22  L4-5 XLIF with posterior spinal fusion and right L5-S1 microdiscectomy   HISTORY OF PRESENT ILLNESS: Timothy Gilmore is doing very well. He has minimal pain    PHYSICAL EXAMINATION:  General: Patient is well developed, well nourished, calm, collected, and in no apparent distress.   NEUROLOGICAL:  General: In no acute distress.   Awake, alert, oriented to person, place, and time.  Pupils equal round and reactive to light.  Facial tone is symmetric.    Strength:          Side Iliopsoas Quads Hamstring PF DF EHL  R 5 5 5 5 5 5   L 5 5 5 5 5 5    Incisions are well healed.    ROS (Neurologic):  Negative except as noted above  IMAGING: Lumbar xrays dated 02/08/23:  No complications noted.   L spine xrays today - stable  ASSESSMENT/PLAN:  Timothy Gilmore is doing well s/p above surgery. I am very pleased with his improvements.  I will see him back on an as-needed basis.  He is cleared to play golf.    Venetia Night MD Department of neurosurgery
